# Patient Record
Sex: Female | Born: 1950 | Race: White | Hispanic: No | Marital: Married | Smoking: Never smoker
Health system: Southern US, Community
[De-identification: ages and names within clinical notes are randomized; demographics above are authoritative.]

## PROBLEM LIST (undated history)

## (undated) DIAGNOSIS — I10 Essential (primary) hypertension: Secondary | ICD-10-CM

## (undated) DIAGNOSIS — K810 Acute cholecystitis: Secondary | ICD-10-CM

## (undated) DIAGNOSIS — H269 Unspecified cataract: Secondary | ICD-10-CM

## (undated) DIAGNOSIS — Z01419 Encounter for gynecological examination (general) (routine) without abnormal findings: Secondary | ICD-10-CM

## (undated) DIAGNOSIS — R112 Nausea with vomiting, unspecified: Secondary | ICD-10-CM

## (undated) DIAGNOSIS — T7840XA Allergy, unspecified, initial encounter: Secondary | ICD-10-CM

## (undated) DIAGNOSIS — E782 Mixed hyperlipidemia: Secondary | ICD-10-CM

## (undated) DIAGNOSIS — Z9889 Other specified postprocedural states: Secondary | ICD-10-CM

## (undated) DIAGNOSIS — M1990 Unspecified osteoarthritis, unspecified site: Secondary | ICD-10-CM

## (undated) HISTORY — DX: Encounter for gynecological examination (general) (routine) without abnormal findings: Z01.419

## (undated) HISTORY — DX: Essential (primary) hypertension: I10

## (undated) HISTORY — DX: Allergy, unspecified, initial encounter: T78.40XA

## (undated) HISTORY — DX: Unspecified cataract: H26.9

## (undated) HISTORY — DX: Unspecified osteoarthritis, unspecified site: M19.90

## (undated) HISTORY — DX: Mixed hyperlipidemia: E78.2

## (undated) HISTORY — DX: Acute cholecystitis: K81.0

## (undated) HISTORY — PX: TONSILLECTOMY: SUR1361

## (undated) HISTORY — PX: OTHER SURGICAL HISTORY: SHX169

---

## 1983-11-11 HISTORY — PX: TUBAL LIGATION: SHX77

## 1999-11-27 ENCOUNTER — Other Ambulatory Visit: Admission: RE | Admit: 1999-11-27 | Discharge: 1999-11-27 | Payer: Self-pay | Admitting: Gynecology

## 2001-07-06 ENCOUNTER — Other Ambulatory Visit: Admission: RE | Admit: 2001-07-06 | Discharge: 2001-07-06 | Payer: Self-pay | Admitting: Obstetrics & Gynecology

## 2003-01-04 ENCOUNTER — Other Ambulatory Visit: Admission: RE | Admit: 2003-01-04 | Discharge: 2003-01-04 | Payer: Self-pay | Admitting: Obstetrics & Gynecology

## 2004-06-11 ENCOUNTER — Other Ambulatory Visit: Admission: RE | Admit: 2004-06-11 | Discharge: 2004-06-11 | Payer: Self-pay | Admitting: Obstetrics and Gynecology

## 2005-12-31 ENCOUNTER — Ambulatory Visit: Payer: Self-pay | Admitting: Family Medicine

## 2006-01-08 ENCOUNTER — Ambulatory Visit: Payer: Self-pay | Admitting: Family Medicine

## 2006-01-16 ENCOUNTER — Ambulatory Visit: Payer: Self-pay | Admitting: Family Medicine

## 2006-02-08 HISTORY — PX: COLONOSCOPY: SHX174

## 2006-02-09 ENCOUNTER — Ambulatory Visit: Payer: Self-pay | Admitting: Internal Medicine

## 2006-02-23 ENCOUNTER — Encounter (INDEPENDENT_AMBULATORY_CARE_PROVIDER_SITE_OTHER): Payer: Self-pay | Admitting: Specialist

## 2006-02-23 ENCOUNTER — Ambulatory Visit: Payer: Self-pay | Admitting: Internal Medicine

## 2007-07-23 DIAGNOSIS — E782 Mixed hyperlipidemia: Secondary | ICD-10-CM

## 2007-07-23 DIAGNOSIS — I10 Essential (primary) hypertension: Secondary | ICD-10-CM

## 2007-07-23 HISTORY — DX: Mixed hyperlipidemia: E78.2

## 2007-07-23 HISTORY — DX: Essential (primary) hypertension: I10

## 2008-01-20 ENCOUNTER — Ambulatory Visit: Payer: Self-pay | Admitting: Family Medicine

## 2008-01-20 DIAGNOSIS — K644 Residual hemorrhoidal skin tags: Secondary | ICD-10-CM

## 2008-01-21 ENCOUNTER — Telehealth: Payer: Self-pay | Admitting: Family Medicine

## 2008-06-16 ENCOUNTER — Ambulatory Visit: Payer: Self-pay | Admitting: Family Medicine

## 2009-10-16 ENCOUNTER — Telehealth: Payer: Self-pay | Admitting: Family Medicine

## 2009-10-17 ENCOUNTER — Ambulatory Visit: Payer: Self-pay | Admitting: Family Medicine

## 2010-01-02 ENCOUNTER — Ambulatory Visit: Payer: Self-pay | Admitting: Family Medicine

## 2010-01-02 DIAGNOSIS — J069 Acute upper respiratory infection, unspecified: Secondary | ICD-10-CM

## 2010-11-21 ENCOUNTER — Ambulatory Visit
Admission: RE | Admit: 2010-11-21 | Discharge: 2010-11-21 | Payer: Self-pay | Source: Home / Self Care | Attending: Family Medicine | Admitting: Family Medicine

## 2010-11-21 DIAGNOSIS — M545 Low back pain, unspecified: Secondary | ICD-10-CM | POA: Insufficient documentation

## 2010-12-10 NOTE — Assessment & Plan Note (Signed)
Summary: congestion//ccm   Vital Signs:  Patient profile:   60 year old female Weight:      216 pounds Temp:     99.1 degrees F oral Pulse rate:   98 / minute BP sitting:   112 / 78  (left arm) Cuff size:   large  Vitals Entered By: Alfred Levins, CMA (January 02, 2010 11:22 AM) CC: st, fever, cough   History of Present Illness: Here for 2 days of aches, HA, sinus congestion, ST, and a dry cough. No NVD.   Current Medications (verified): 1)  Diovan Hct 160-25 Mg Tabs (Valsartan-Hydrochlorothiazide) .Marland Kitchen.. 1 By Mouth Once Daily 2)  Aspirin 325 Mg Tabs (Aspirin) .... Take 1 Tab By Mouth Every Day  Allergies (verified): No Known Drug Allergies  Past History:  Past Medical History: Reviewed history from 10/17/2009 and no changes required. Hyperlipidemia Hypertension sees Dr. Ilda Mori for GYN exams  Review of Systems  The patient denies anorexia, fever, weight loss, weight gain, vision loss, decreased hearing, hoarseness, chest pain, syncope, dyspnea on exertion, peripheral edema, hemoptysis, abdominal pain, melena, hematochezia, severe indigestion/heartburn, hematuria, incontinence, genital sores, muscle weakness, suspicious skin lesions, transient blindness, difficulty walking, depression, unusual weight change, abnormal bleeding, enlarged lymph nodes, angioedema, breast masses, and testicular masses.    Physical Exam  General:  Well-developed,well-nourished,in no acute distress; alert,appropriate and cooperative throughout examination Head:  Normocephalic and atraumatic without obvious abnormalities. No apparent alopecia or balding. Eyes:  No corneal or conjunctival inflammation noted. EOMI. Perrla. Funduscopic exam benign, without hemorrhages, exudates or papilledema. Vision grossly normal. Ears:  External ear exam shows no significant lesions or deformities.  Otoscopic examination reveals clear canals, tympanic membranes are intact bilaterally without bulging,  retraction, inflammation or discharge. Hearing is grossly normal bilaterally. Nose:  External nasal examination shows no deformity or inflammation. Nasal mucosa are pink and moist without lesions or exudates. Mouth:  Oral mucosa and oropharynx without lesions or exudates.  Teeth in good repair. Neck:  No deformities, masses, or tenderness noted. Lungs:  Normal respiratory effort, chest expands symmetrically. Lungs are clear to auscultation, no crackles or wheezes.   Impression & Recommendations:  Problem # 1:  VIRAL URI (ICD-465.9)  Her updated medication list for this problem includes:    Aspirin 325 Mg Tabs (Aspirin) .Marland Kitchen... Take 1 tab by mouth every day    Hydromet 5-1.5 Mg/52ml Syrp (Hydrocodone-homatropine) .Marland Kitchen... 1 tsp q 4 hours as needed cough  Complete Medication List: 1)  Diovan Hct 160-25 Mg Tabs (Valsartan-hydrochlorothiazide) .Marland Kitchen.. 1 by mouth once daily 2)  Aspirin 325 Mg Tabs (Aspirin) .... Take 1 tab by mouth every day 3)  Hydromet 5-1.5 Mg/59ml Syrp (Hydrocodone-homatropine) .Marland Kitchen.. 1 tsp q 4 hours as needed cough  Patient Instructions: 1)  rest, fluids. off work today and tomorrow Prescriptions: HYDROMET 5-1.5 MG/5ML SYRP (HYDROCODONE-HOMATROPINE) 1 tsp q 4 hours as needed cough  #240 x 0   Entered and Authorized by:   Nelwyn Salisbury MD   Signed by:   Nelwyn Salisbury MD on 01/02/2010   Method used:   Print then Give to Patient   RxID:   [id number]

## 2010-12-12 NOTE — Assessment & Plan Note (Signed)
Summary: consult re: various issues/cjr   Vital Signs:  Patient profile:   60 year old female Weight:      209 pounds O2 Sat:      96 % Pulse rate:   98 / minute BP sitting:   140 / 86  (left arm) Cuff size:   large  Vitals Entered By: Pura Spice, RN (November 21, 2010 8:41 AM) CC: mid back pain going to lower back x 2 months    History of Present Illness: Here for 2 months of constant dull aching in the lower back. No trauma hx. She also has pain in both knees. No urinary symtpoms. Takes 800 mg of Motrin two times a day usually.   Allergies (verified): No Known Drug Allergies  Past History:  Past Medical History: Hyperlipidemia Hypertension sees Dr. Ilda Mori for GYN exams Low back pain  Past Surgical History: Reviewed history from 07/23/2007 and no changes required. Tubal ligation Tonsillectomy Colonoscopy  Review of Systems  The patient denies anorexia, fever, weight loss, weight gain, vision loss, decreased hearing, hoarseness, chest pain, syncope, dyspnea on exertion, peripheral edema, prolonged cough, headaches, hemoptysis, abdominal pain, melena, hematochezia, severe indigestion/heartburn, hematuria, incontinence, genital sores, muscle weakness, suspicious skin lesions, transient blindness, difficulty walking, depression, unusual weight change, abnormal bleeding, enlarged lymph nodes, angioedema, breast masses, and testicular masses.    Physical Exam  General:  Well-developed,well-nourished,in no acute distress; alert,appropriate and cooperative throughout examination Lungs:  Normal respiratory effort, chest expands symmetrically. Lungs are clear to auscultation, no crackles or wheezes. Heart:  Normal rate and regular rhythm. S1 and S2 normal without gallop, murmur, click, rub or other extra sounds. Abdomen:  Bowel sounds positive,abdomen soft and non-tender without masses, organomegaly or hernias noted. Msk:  No deformity or scoliosis noted of thoracic or  lumbar spine.     Impression & Recommendations:  Problem # 1:  LOW BACK PAIN (ICD-724.2)  Her updated medication list for this problem includes:    Aspirin 325 Mg Tabs (Aspirin) .Marland Kitchen... Take 1 tab by mouth every day    Etodolac 500 Mg Tabs (Etodolac) .Marland Kitchen..Marland Kitchen Two times a day  Orders: T-Lumbar Spine Complete, 5 Views (71110TC)  Complete Medication List: 1)  Diovan Hct 160-25 Mg Tabs (Valsartan-hydrochlorothiazide) .Marland Kitchen.. 1 by mouth once daily 2)  Aspirin 325 Mg Tabs (Aspirin) .... Take 1 tab by mouth every day 3)  Etodolac 500 Mg Tabs (Etodolac) .... Two times a day  Patient Instructions: 1)  this is probably due to degenerative spine disease. Suggested Pilates exercises and weight reduction. try Etodolac. Get Xrays of the lumbar spine  Prescriptions: ETODOLAC 500 MG TABS (ETODOLAC) two times a day  #60 x 11   Entered and Authorized by:   Nelwyn Salisbury MD   Signed by:   Nelwyn Salisbury MD on 11/21/2010   Method used:   Electronically to        [facility] [phone][facility] (retail)       [address]       [place], [place]  [address]       Ph: [phone]       Fax: [phone]   RxID:   [id number]    Orders Added: 1)  Est. Patient Level IV [24401] 2)  T-Lumbar Spine Complete, 5 Views [71110TC]

## 2011-01-01 ENCOUNTER — Ambulatory Visit (INDEPENDENT_AMBULATORY_CARE_PROVIDER_SITE_OTHER): Payer: Managed Care, Other (non HMO) | Admitting: Family Medicine

## 2011-01-01 ENCOUNTER — Encounter: Payer: Self-pay | Admitting: Family Medicine

## 2011-01-01 VITALS — BP 100/60 | HR 94 | Temp 97.8°F

## 2011-01-01 DIAGNOSIS — R42 Dizziness and giddiness: Secondary | ICD-10-CM

## 2011-01-01 MED ORDER — MECLIZINE HCL 25 MG PO TABS: 25.0000 mg | ORAL_TABLET | ORAL | Status: DC | PRN

## 2011-01-01 NOTE — Progress Notes (Signed)
  Subjective:    Patient ID: Alicia Mcbride, female    DOB: 06-Dec-1950, 60 y.o.   MRN: [id number]  HPI Here for several days of dizziness which comes and goes. She describes this as the room spinning around her. It is worse when she moves her head suddenly. No nausea or HA with these spells. No vision changes or other neurologic deficits.    Review of Systems  Constitutional: Negative.   HENT: Negative.   Eyes: Negative.   Respiratory: Negative.   Neurological: Positive for dizziness. Negative for tremors, seizures, syncope, speech difficulty, weakness, light-headedness, numbness and headaches.       Objective:   Physical Exam        Assessment & Plan:  Rest, drink fluids. Off work Advertising account executive.

## 2011-05-12 ENCOUNTER — Other Ambulatory Visit: Payer: Self-pay | Admitting: Family Medicine

## 2011-05-28 ENCOUNTER — Telehealth: Payer: Self-pay

## 2011-05-28 NOTE — Telephone Encounter (Signed)
Pt has been on diovan for several years and new insurance co-pay for diovan is $85. [place] and wants to know if she can get samples until she can see Dr. Clent Ridges to discuss other options.

## 2011-05-29 NOTE — Telephone Encounter (Signed)
Left message to notify pt that we have no diovan samples in the office right now

## 2011-06-19 ENCOUNTER — Telehealth: Payer: Self-pay | Admitting: Family Medicine

## 2011-06-19 MED ORDER — VALSARTAN-HYDROCHLOROTHIAZIDE 160-25 MG PO TABS: 1.0000 | ORAL_TABLET | Freq: Every day | ORAL | Status: DC

## 2011-06-19 NOTE — Telephone Encounter (Signed)
Pt requesting 90 day refill on DIOVAN HCT 160-25 MG per tablet . Pt said with her new insurance plan she can get the meds discounted if she gets a 90 day supply. Pt has currently been off the medication for 2 week and if she cant get the 90 day supply she will not be able to afford it.

## 2011-06-19 NOTE — Telephone Encounter (Signed)
Script sent e-scribe 

## 2011-06-20 ENCOUNTER — Other Ambulatory Visit: Payer: Self-pay | Admitting: Family Medicine

## 2011-06-20 MED ORDER — VALSARTAN-HYDROCHLOROTHIAZIDE 160-25 MG PO TABS: 1.0000 | ORAL_TABLET | Freq: Every day | ORAL | Status: DC

## 2011-09-25 ENCOUNTER — Other Ambulatory Visit: Payer: Self-pay | Admitting: Family Medicine

## 2011-09-25 MED ORDER — LOSARTAN POTASSIUM-HCTZ 100-25 MG PO TABS: 1.0000 | ORAL_TABLET | Freq: Every day | ORAL | Status: DC

## 2011-09-25 NOTE — Telephone Encounter (Signed)
Pls advise.  

## 2011-09-25 NOTE — Telephone Encounter (Signed)
rx sent into pharmacy

## 2011-09-25 NOTE — Telephone Encounter (Signed)
Pt called and said that if no samples for diovan are avail, pt is req a [facility] [place] [facility]. Pt says that shes been out of bp med for a wk and feels that her bp is high because she is dizzy. Pls call asap. Pt says insurance will cover the Losartan.

## 2011-09-25 NOTE — Telephone Encounter (Addendum)
Pt need samples of diovan 160-25mg . Pt is out

## 2011-09-25 NOTE — Telephone Encounter (Signed)
Stop the Diovan Hct. Call in Losartan HCT 100/25 q day for one year

## 2011-11-20 ENCOUNTER — Ambulatory Visit (INDEPENDENT_AMBULATORY_CARE_PROVIDER_SITE_OTHER): Payer: 59 | Admitting: Family Medicine

## 2011-11-20 ENCOUNTER — Encounter: Payer: Self-pay | Admitting: Family Medicine

## 2011-11-20 VITALS — BP 126/84 | HR 79 | Temp 98.4°F | Ht 67.0 in | Wt 215.0 lb

## 2011-11-20 DIAGNOSIS — Z860100 Personal history of colon polyps, unspecified: Secondary | ICD-10-CM

## 2011-11-20 DIAGNOSIS — Z0000 Encounter for general adult medical examination without abnormal findings: Secondary | ICD-10-CM

## 2011-11-20 LAB — CBC WITH DIFFERENTIAL/PLATELET
Eosinophils Relative: 2 % (ref 0.0–5.0)
HCT: 42.3 % (ref 36.0–46.0)
Lymphs Abs: 2 10*3/uL (ref 0.7–4.0)
MCHC: 34.9 g/dL (ref 30.0–36.0)
MCV: 89.4 fl (ref 78.0–100.0)
Monocytes Absolute: 0.4 10*3/uL (ref 0.1–1.0)
Platelets: 305 10*3/uL (ref 150.0–400.0)
RDW: 13.4 % (ref 11.5–14.6)
WBC: 7.2 10*3/uL (ref 4.5–10.5)

## 2011-11-20 LAB — BASIC METABOLIC PANEL
CO2: 29 mEq/L (ref 19–32)
Chloride: 102 mEq/L (ref 96–112)
Glucose, Bld: 89 mg/dL (ref 70–99)
Sodium: 140 mEq/L (ref 135–145)

## 2011-11-20 LAB — HEPATIC FUNCTION PANEL
AST: 14 U/L (ref 0–37)
Alkaline Phosphatase: 69 U/L (ref 39–117)
Bilirubin, Direct: 0.1 mg/dL (ref 0.0–0.3)
Total Bilirubin: 0.9 mg/dL (ref 0.3–1.2)

## 2011-11-20 LAB — POCT URINALYSIS DIPSTICK
Glucose, UA: NEGATIVE
Ketones, UA: NEGATIVE
Spec Grav, UA: 1.025

## 2011-11-20 LAB — LIPID PANEL
Cholesterol: 222 mg/dL — ABNORMAL HIGH (ref 0–200)
Total CHOL/HDL Ratio: 4

## 2011-11-20 LAB — LDL CHOLESTEROL, DIRECT: Direct LDL: 137.1 mg/dL

## 2011-11-20 LAB — TSH: TSH: 0.68 u[IU]/mL (ref 0.35–5.50)

## 2011-11-20 MED ORDER — MELOXICAM 15 MG PO TABS: 15.0000 mg | ORAL_TABLET | Freq: Every day | ORAL | Status: AC

## 2011-11-20 NOTE — Progress Notes (Signed)
  Subjective:    Patient ID: Alicia Mcbride, female    DOB: 07-15-1951, 61 y.o.   MRN: [id number]  HPI 61 yr old female for a cpx. She feels well except for leg pains. 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 However in the past few months she has also been having crampy pains in the calves. No swelling in the feet.    Review of Systems  Constitutional: Negative.   HENT: Negative.   Eyes: Negative.   Respiratory: Negative.   Cardiovascular: Negative.   Gastrointestinal: Negative.   Genitourinary: Negative for dysuria, urgency, frequency, hematuria, flank pain, decreased urine volume, enuresis, difficulty urinating, pelvic pain and dyspareunia.  Musculoskeletal: Negative.   Skin: Negative.   Neurological: Negative.   Hematological: Negative.   Psychiatric/Behavioral: Negative.        Objective:   Physical Exam  Constitutional: She is oriented to person, place, and time. She appears well-developed and well-nourished. No distress.  HENT:  Head: Normocephalic and atraumatic.  Right Ear: External ear normal.  Left Ear: External ear normal.  Nose: Nose normal.  Mouth/Throat: Oropharynx is clear and moist. No oropharyngeal exudate.  Eyes: Conjunctivae and EOM are normal. Pupils are equal, round, and reactive to light. No scleral icterus.  Neck: Normal range of motion. Neck supple. No JVD present. No thyromegaly present.  Cardiovascular: Normal rate, regular rhythm, normal heart sounds and intact distal pulses.  Exam reveals no gallop and no friction rub.   No murmur heard.      EKG normal   Pulmonary/Chest: Effort normal and breath sounds normal. No respiratory distress. She has no wheezes. She has no rales. She exhibits no tenderness.  Abdominal: Soft. Bowel sounds are normal. She exhibits no distension and no mass. There is no tenderness. There is no rebound and no guarding.  Musculoskeletal: Normal range of motion. She exhibits no  edema and no tenderness.  Lymphadenopathy:    She has no cervical adenopathy.  Neurological: She is alert and oriented to person, place, and time. She has normal reflexes. No cranial nerve deficit. She exhibits normal muscle tone. Coordination normal.  Skin: Skin is warm and dry. No rash noted. No erythema.  Psychiatric: She has a normal mood and affect. Her behavior is normal. Judgment and thought content normal.          Assessment & Plan:  She probably has a low potassium from taking a diuretic, but we will look at her labs which will be drawn today. Switch from Etodolac to Meloxicam. Get another colonoscopy.

## 2011-11-25 ENCOUNTER — Telehealth: Payer: Self-pay | Admitting: *Deleted

## 2011-11-25 ENCOUNTER — Encounter: Payer: Self-pay | Admitting: Family Medicine

## 2011-11-25 NOTE — Telephone Encounter (Signed)
Spoke with pt and put a copy in mail. 

## 2011-11-25 NOTE — Telephone Encounter (Signed)
Pt would like lab results, please. 

## 2011-11-25 NOTE — Progress Notes (Signed)
Quick Note:  Spoke with pt and put a copy of results in mail. ______ 

## 2012-12-28 ENCOUNTER — Telehealth: Payer: Self-pay | Admitting: Family Medicine

## 2012-12-28 MED ORDER — LOSARTAN POTASSIUM-HCTZ 100-25 MG PO TABS: 1.0000 | ORAL_TABLET | Freq: Every day | ORAL | Status: DC

## 2012-12-28 NOTE — Telephone Encounter (Signed)
Refill request for Losartan/HCTZ 100-25 mg take 1 po qd and send to [facility], which I did send e-scribe.

## 2013-01-26 ENCOUNTER — Telehealth: Payer: Self-pay | Admitting: Family Medicine

## 2013-01-26 NOTE — Telephone Encounter (Signed)
Refill request for Mobic 15 mg take 1 po qd and send to [facility] pharmacy

## 2013-01-28 MED ORDER — MELOXICAM 15 MG PO TABS: 15.0000 mg | ORAL_TABLET | Freq: Every day | ORAL | Status: DC

## 2013-01-28 NOTE — Telephone Encounter (Signed)
I sent script e-scribe. 

## 2013-03-04 ENCOUNTER — Telehealth: Payer: Self-pay | Admitting: Family Medicine

## 2013-03-04 NOTE — Telephone Encounter (Signed)
Refill for one month. She needs an OV soon

## 2013-03-04 NOTE — Telephone Encounter (Signed)
Refill request for Losartan/HCTZ 100-25 mg, can we refill this?

## 2013-03-07 MED ORDER — LOSARTAN POTASSIUM-HCTZ 100-25 MG PO TABS: 1.0000 | ORAL_TABLET | Freq: Every day | ORAL | Status: DC

## 2013-03-07 NOTE — Telephone Encounter (Signed)
I sent script e-scribe and left voice message for pt with below information. 

## 2013-04-12 ENCOUNTER — Telehealth: Payer: Self-pay | Admitting: Family Medicine

## 2013-04-12 NOTE — Telephone Encounter (Signed)
Pt needs a refill on meloxicam and losartan-hctz call into [facility] [phone]. Pt has appt on 04-29-13. Pt is getting married unable to come in sooner

## 2013-04-13 MED ORDER — LOSARTAN POTASSIUM-HCTZ 100-25 MG PO TABS: 1.0000 | ORAL_TABLET | Freq: Every day | ORAL | Status: DC

## 2013-04-13 MED ORDER — MELOXICAM 15 MG PO TABS: 15.0000 mg | ORAL_TABLET | Freq: Every day | ORAL | Status: DC

## 2013-04-13 NOTE — Telephone Encounter (Signed)
I sent scripts e-scribe and left voice message for pt. 

## 2013-04-29 ENCOUNTER — Ambulatory Visit (INDEPENDENT_AMBULATORY_CARE_PROVIDER_SITE_OTHER): Payer: 59 | Admitting: Family Medicine

## 2013-04-29 ENCOUNTER — Encounter: Payer: Self-pay | Admitting: Family Medicine

## 2013-04-29 VITALS — BP 126/78 | HR 87 | Temp 97.6°F | Wt 215.0 lb

## 2013-04-29 DIAGNOSIS — E785 Hyperlipidemia, unspecified: Secondary | ICD-10-CM

## 2013-04-29 DIAGNOSIS — M1710 Unilateral primary osteoarthritis, unspecified knee: Secondary | ICD-10-CM | POA: Insufficient documentation

## 2013-04-29 DIAGNOSIS — M1990 Unspecified osteoarthritis, unspecified site: Secondary | ICD-10-CM

## 2013-04-29 DIAGNOSIS — I10 Essential (primary) hypertension: Secondary | ICD-10-CM

## 2013-04-29 LAB — POCT URINALYSIS DIPSTICK
Ketones, UA: NEGATIVE
Protein, UA: NEGATIVE
Spec Grav, UA: 1.01
pH, UA: 7

## 2013-04-29 LAB — HEPATIC FUNCTION PANEL
ALT: 20 U/L (ref 0–35)
AST: 19 U/L (ref 0–37)
Bilirubin, Direct: 0.1 mg/dL (ref 0.0–0.3)
Total Bilirubin: 0.8 mg/dL (ref 0.3–1.2)

## 2013-04-29 LAB — LIPID PANEL: VLDL: 32.2 mg/dL (ref 0.0–40.0)

## 2013-04-29 LAB — BASIC METABOLIC PANEL
BUN: 16 mg/dL (ref 6–23)
Chloride: 99 mEq/L (ref 96–112)
Potassium: 4.2 mEq/L (ref 3.5–5.1)

## 2013-04-29 MED ORDER — LOSARTAN POTASSIUM-HCTZ 100-25 MG PO TABS: 1.0000 | ORAL_TABLET | Freq: Every day | ORAL | Status: DC

## 2013-04-29 MED ORDER — MELOXICAM 15 MG PO TABS: 15.0000 mg | ORAL_TABLET | Freq: Every day | ORAL | Status: DC

## 2013-04-29 NOTE — Progress Notes (Signed)
  Subjective:    Patient ID: Alicia Mcbride, female    DOB: 1950/12/29, 62 y.o.   MRN: [id number]  HPI Here for follow up. She feels good and has no concerns. Her BP is stable. She recently remarried and is quite happy. Thinking about retiring next year.    Review of Systems  Constitutional: Negative.   Respiratory: Negative.   Cardiovascular: Negative.        Objective:   Physical Exam  Constitutional: She appears well-developed and well-nourished.  Cardiovascular: Normal rate, regular rhythm, normal heart sounds and intact distal pulses.   Pulmonary/Chest: Effort normal and breath sounds normal.          Assessment & Plan:  She is doing well. Get fasting labs

## 2013-04-30 LAB — CBC WITH DIFFERENTIAL/PLATELET
Basophils Absolute: 0.1 10*3/uL (ref 0.0–0.1)
Eosinophils Absolute: 0.2 10*3/uL (ref 0.0–0.7)
HCT: 44.9 % (ref 36.0–46.0)
Hemoglobin: 15.1 g/dL — ABNORMAL HIGH (ref 12.0–15.0)
Lymphs Abs: 2.1 10*3/uL (ref 0.7–4.0)
MCHC: 33.5 g/dL (ref 30.0–36.0)
MCV: 92.5 fl (ref 78.0–100.0)
Neutro Abs: 6.3 10*3/uL (ref 1.4–7.7)
RDW: 13.9 % (ref 11.5–14.6)

## 2013-05-04 NOTE — Progress Notes (Signed)
Quick Note:  I spoke with pt and she declined to start on Lipitor, she did agree to watch her diet more closely. ______

## 2014-01-23 ENCOUNTER — Other Ambulatory Visit: Payer: Self-pay | Admitting: Obstetrics & Gynecology

## 2014-01-23 DIAGNOSIS — R928 Other abnormal and inconclusive findings on diagnostic imaging of breast: Secondary | ICD-10-CM

## 2014-01-26 ENCOUNTER — Ambulatory Visit
Admission: RE | Admit: 2014-01-26 | Discharge: 2014-01-26 | Disposition: A | Discharge status: Home / Self Care | Payer: 59 | Source: Ambulatory Visit | Attending: Obstetrics & Gynecology | Admitting: Obstetrics & Gynecology

## 2014-01-26 DIAGNOSIS — R928 Other abnormal and inconclusive findings on diagnostic imaging of breast: Secondary | ICD-10-CM

## 2014-02-02 ENCOUNTER — Other Ambulatory Visit: Payer: 59

## 2014-02-15 ENCOUNTER — Telehealth: Payer: Self-pay | Admitting: Family Medicine

## 2014-02-15 NOTE — Telephone Encounter (Signed)
Patient Information:  Caller Name: Narely  Phone: [phone]  Patient: Alicia Mcbride, Alicia Mcbride  Gender: Female  DOB: [birth year]  Age: 63 Years  PCP: [name] [facility])  Office Follow Up:  Does the office need to follow up with this patient?: Yes  Instructions For The Office: Please return call to patient at [phone] regarding appt. need.  RN Note:  Patient states she noted a Tick on her abdomen 02/14/14. Patient states she removed the tick 02/15/14. States tick was removed with head intact. Patient states she has a raised, itchy area on her abdomen approx. the size of a  " [place] Dollar." States no redness or red streaks noted. Care advice given per guidelines. Patient advised to cleanse area with soap and water. Then cleanse with alcohol. Callback parameters reviewed. Patient verbalizes understanding. No appts. available at [facility] office. Patient agrees to be seen at [facility] office. No appts. available at the [facility] office.  Please return call to patient at [phone] regarding appt. need.    Symptoms  Reason For Call & Symptoms: Tick bite  Reviewed Health History In EMR: Yes  Reviewed Medications In EMR: Yes  Reviewed Allergies In EMR: Yes  Reviewed Surgeries / Procedures: Yes  Date of Onset of Symptoms: 02/14/2014  Treatments Tried: Cleansed with Peroxide  Treatments Tried Worked: No  Guideline(s) Used:  Tick Bite  Disposition Per Guideline:   See Today in Office  Reason For Disposition Reached:   Probable deer tick that was attached > 24 hours (or tick appears swollen, not flat)  Advice Given:  Antibiotic Ointment:  Wash the wound and your hands with soap and water after removal to prevent catching any tick disease. Apply an over-the-counter antibiotic ointment (e.g., bacitracin) to the bite once.  Call Back If:  Fever or rash occur in the next 2 weeks  Bite begins to look infected  You become worse.  Patient Will Follow Care Advice:   YES

## 2014-02-15 NOTE — Telephone Encounter (Signed)
I spoke with Dr. Regis Bill and after reviewing note from triage, she advised that pt can be seen on 02/16/14. The guidelines for tick bite include: prophylaxis is begun within 72 hours of tick removal. Pt just removed tick this morning. We have no openings here today. I spoke with pt and went over this information and pt has office visit scheduled for 02/16/14 with Dr. Regis Bill.

## 2014-02-16 ENCOUNTER — Encounter: Payer: Self-pay | Admitting: Internal Medicine

## 2014-02-16 ENCOUNTER — Ambulatory Visit (INDEPENDENT_AMBULATORY_CARE_PROVIDER_SITE_OTHER): Payer: 59 | Admitting: Internal Medicine

## 2014-02-16 VITALS — BP 126/70 | Temp 98.6°F | Wt 208.0 lb

## 2014-02-16 DIAGNOSIS — S30860A Insect bite (nonvenomous) of lower back and pelvis, initial encounter: Secondary | ICD-10-CM

## 2014-02-16 DIAGNOSIS — R21 Rash and other nonspecific skin eruption: Secondary | ICD-10-CM

## 2014-02-16 DIAGNOSIS — S30861A Insect bite (nonvenomous) of abdominal wall, initial encounter: Secondary | ICD-10-CM

## 2014-02-16 DIAGNOSIS — W57XXXA Bitten or stung by nonvenomous insect and other nonvenomous arthropods, initial encounter: Principal | ICD-10-CM

## 2014-02-16 MED ORDER — DOXYCYCLINE HYCLATE 100 MG PO CAPS: 100.0000 mg | ORAL_CAPSULE | Freq: Two times a day (BID) | ORAL | Status: DC

## 2014-02-16 NOTE — Progress Notes (Signed)
Chief Complaint  Patient presents with  . Tick Bite    Removed from abdomen.      HPI: Patient comes in today for   new problem evaluation.PCP NA  See notes  From yesterday . Tick bite removed yesterday  Right trunk  ? If deer tick  Travel no  Not outside that much but has a dog and wooded area  Uncertain how lont the tick attacked    Had etoh on easter Sunday and then noticed it  And thinks it was dead when removed  Area around bite was redder yesterday  About 4-5 cm  Fading some  Itches  No other rash fever joint sx.   ROS: See pertinent positives and negatives per HPI.  Past Medical History  Diagnosis Date  . Hypertension   . Allergy   . Gynecological examination     sees Dr. Alden Hipp    Family History  Problem Relation Age of Onset  . Hypertension Mother   . Thyroid disease Father     History   Social History  . Marital Status: Single    Spouse Name: N/A    Number of Children: N/A  . Years of Education: N/A   Social History Main Topics  . Smoking status: Never Smoker   . Smokeless tobacco: Never Used  . Alcohol Use: 4.2 oz/week    7 Glasses of wine per week  . Drug Use: No  . Sexual Activity: None   Other Topics Concern  . None   Social History Narrative  . None    Outpatient Encounter Prescriptions as of 02/16/2014  Medication Sig  . losartan-hydrochlorothiazide (HYZAAR) 100-25 MG per tablet Take 1 tablet by mouth daily.  . meloxicam (MOBIC) 15 MG tablet Take 1 tablet (15 mg total) by mouth daily.  Marland Kitchen doxycycline (VIBRAMYCIN) 100 MG capsule Take 1 capsule (100 mg total) by mouth 2 (two) times daily.    EXAM:  BP 126/70  Temp(Src) 98.6 F (37 C) (Oral)  Wt 208 lb (94.348 kg)  Body mass index is 32.57 kg/(m^2).  GENERAL: vitals reviewed and listed above, alert, oriented, appears well hydrated and in no acute distress HEENT: atraumatic, conjunctiva  clear, no obvious abnormalities on inspection of external nose and ears   NECK: no obvious  masses on inspection palpation  CV: HRRR, no clubbing cyanosis  nl cap refill  Skin right abd trunk mid cnetral redness without  Some induration and no fluctuances and linear  Area edged about 5 cm round with some clearing  Faded  MS: moves all extremities without noticeable focal  abnormality PSYCH: pleasant and cooperative, no obvious depression or anxiety Tick dead is very small about 1-2 mm ? Uniform color without lone star dot ? If wood vs deer tick  ASSESSMENT AND PLAN:  Discussed the following assessment and plan:  Tick bite of abdomen - tick is very shirveled and uncertain ID     Rash - around bite  fading but has round ridge could mkae a case for EM  tick  Location not high risk but because of rash  And unknown length of attachment  Will add wmpiric antibiotic  benefit more than risk at this time -Patient advised to return or notify health care team  if symptoms worsen ,persist or new concerns arise.  Patient Instructions  ncertain if this is a deer tick or wood tick etc .  Because of the rash   This could be just a local reaction  Contact us if alarm issues   Tick Bite Information Ticks are insects that attach themselves to the skin and draw blood for food. There are various types of ticks. Common types include wood ticks and deer ticks. Most ticks live in shrubs and grassy areas. Ticks can climb onto your body when you make contact with leaves or grass where the tick is waiting. The most common places on the body for ticks to attach themselves are the scalp, neck, armpits, waist, and groin. Most tick bites are harmless, but sometimes ticks carry germs that cause diseases. These germs can be spread to a person during the tick's feeding process. The chance of a disease spreading through a tick bite depends on:   The type of tick.  Time of year.   How long the tick is attached.   Geographic location.  HOW CAN YOU PREVENT TICK BITES? Take these steps to help prevent tick  bites when you are outdoors:  Wear protective clothing. Long sleeves and long pants are best.   Wear white clothes so you can see ticks more easily.  Tuck your pant legs into your socks.   If walking on a trail, stay in the middle of the trail to avoid brushing against bushes.  Avoid walking through areas with long grass.  Put insect repellent on all exposed skin and along boot tops, pant legs, and sleeve cuffs.   Check clothing, hair, and skin repeatedly and before going inside.   Brush off any ticks that are not attached.  Take a shower or bath as soon as possible after being outdoors.  WHAT IS THE PROPER WAY TO REMOVE A TICK? Ticks should be removed as soon as possible to help prevent diseases caused by tick bites. 1. If latex gloves are available, put them on before trying to remove a tick.  2. Using fine-point tweezers, grasp the tick as close to the skin as possible. You may also use curved forceps or a tick removal tool. Grasp the tick as close to its head as possible. Avoid grasping the tick on its body. 3. Pull gently with steady upward pressure until the tick lets go. Do not twist the tick or jerk it suddenly. This may break off the tick's head or mouth parts. 4. Do not squeeze or crush the tick's body. This could force disease-carrying fluids from the tick into your body.  5. After the tick is removed, wash the bite area and your hands with soap and water or other disinfectant such as alcohol. 6. Apply a small amount of antiseptic cream or ointment to the bite site.  7. Wash and disinfect any instruments that were used.  Do not try to remove a tick by applying a hot match, petroleum jelly, or fingernail polish to the tick. These methods do not work and may increase the chances of disease being spread from the tick bite.  WHEN SHOULD YOU SEEK MEDICAL CARE? Contact your health care provider if you are unable to remove a tick from your skin or if a part of the tick  breaks off and is stuck in the skin.  After a tick bite, you need to be aware of signs and symptoms that could be related to diseases spread by ticks. Contact your health care provider if you develop any of the following in the days or weeks after the tick bite:  Unexplained fever.  Rash. A circular rash that appears days or weeks after the tick bite may indicate the  possibility of Lyme disease. The rash may resemble a target with a bull's-eye and may occur at a different part of your body than the tick bite.  Redness and swelling in the area of the tick bite.   Tender, swollen lymph glands.   Diarrhea.   Weight loss.   Cough.   Fatigue.   Muscle, joint, or bone pain.   Abdominal pain.   Headache.   Lethargy or a change in your level of consciousness.  Difficulty walking or moving your legs.   Numbness in the legs.   Paralysis.  Shortness of breath.   Confusion.   Repeated vomiting.  Document Released: 10/24/2000 Document Revised: 08/17/2013 Document Reviewed: 04/06/2013 [facility] Patient Information 2014 [place].    Standley Brooking. Claudio Mondry M.D.  Pre visit review using our clinic review tool, if applicable. No additional management support is needed unless otherwise documented below in the visit note.

## 2014-02-16 NOTE — Patient Instructions (Addendum)
ncertain if this is a deer tick or wood tick etc .  Because of the rash   This could be just a local reaction   Contact us if alarm issues   Tick Bite Information Ticks are insects that attach themselves to the skin and draw blood for food. There are various types of ticks. Common types include wood ticks and deer ticks. Most ticks live in shrubs and grassy areas. Ticks can climb onto your body when you make contact with leaves or grass where the tick is waiting. The most common places on the body for ticks to attach themselves are the scalp, neck, armpits, waist, and groin. Most tick bites are harmless, but sometimes ticks carry germs that cause diseases. These germs can be spread to a person during the tick's feeding process. The chance of a disease spreading through a tick bite depends on:   The type of tick.  Time of year.   How long the tick is attached.   Geographic location.  HOW CAN YOU PREVENT TICK BITES? Take these steps to help prevent tick bites when you are outdoors:  Wear protective clothing. Long sleeves and long pants are best.   Wear white clothes so you can see ticks more easily.  Tuck your pant legs into your socks.   If walking on a trail, stay in the middle of the trail to avoid brushing against bushes.  Avoid walking through areas with long grass.  Put insect repellent on all exposed skin and along boot tops, pant legs, and sleeve cuffs.   Check clothing, hair, and skin repeatedly and before going inside.   Brush off any ticks that are not attached.  Take a shower or bath as soon as possible after being outdoors.  WHAT IS THE PROPER WAY TO REMOVE A TICK? Ticks should be removed as soon as possible to help prevent diseases caused by tick bites. 1. If latex gloves are available, put them on before trying to remove a tick.  2. Using fine-point tweezers, grasp the tick as close to the skin as possible. You may also use curved forceps or a tick  removal tool. Grasp the tick as close to its head as possible. Avoid grasping the tick on its body. 3. Pull gently with steady upward pressure until the tick lets go. Do not twist the tick or jerk it suddenly. This may break off the tick's head or mouth parts. 4. Do not squeeze or crush the tick's body. This could force disease-carrying fluids from the tick into your body.  5. After the tick is removed, wash the bite area and your hands with soap and water or other disinfectant such as alcohol. 6. Apply a small amount of antiseptic cream or ointment to the bite site.  7. Wash and disinfect any instruments that were used.  Do not try to remove a tick by applying a hot match, petroleum jelly, or fingernail polish to the tick. These methods do not work and may increase the chances of disease being spread from the tick bite.  WHEN SHOULD YOU SEEK MEDICAL CARE? Contact your health care provider if you are unable to remove a tick from your skin or if a part of the tick breaks off and is stuck in the skin.  After a tick bite, you need to be aware of signs and symptoms that could be related to diseases spread by ticks. Contact your health care provider if you develop any of the following in the  days or weeks after the tick bite:  Unexplained fever.  Rash. A circular rash that appears days or weeks after the tick bite may indicate the possibility of Lyme disease. The rash may resemble a target with a bull's-eye and may occur at a different part of your body than the tick bite.  Redness and swelling in the area of the tick bite.   Tender, swollen lymph glands.   Diarrhea.   Weight loss.   Cough.   Fatigue.   Muscle, joint, or bone pain.   Abdominal pain.   Headache.   Lethargy or a change in your level of consciousness.  Difficulty walking or moving your legs.   Numbness in the legs.   Paralysis.  Shortness of breath.   Confusion.   Repeated vomiting.  Document  Released: 10/24/2000 Document Revised: 08/17/2013 Document Reviewed: 04/06/2013 [facility] Patient Information 2014 [place].

## 2014-05-16 ENCOUNTER — Other Ambulatory Visit: Payer: Self-pay | Admitting: Family Medicine

## 2014-06-06 ENCOUNTER — Ambulatory Visit: Payer: Self-pay | Admitting: Podiatry

## 2014-06-13 ENCOUNTER — Ambulatory Visit: Payer: Self-pay

## 2014-06-13 ENCOUNTER — Ambulatory Visit (INDEPENDENT_AMBULATORY_CARE_PROVIDER_SITE_OTHER): Payer: 59 | Admitting: Podiatry

## 2014-06-13 ENCOUNTER — Encounter: Payer: Self-pay | Admitting: Podiatry

## 2014-06-13 VITALS — BP 151/72 | HR 88 | Resp 12

## 2014-06-13 DIAGNOSIS — R52 Pain, unspecified: Secondary | ICD-10-CM

## 2014-06-13 DIAGNOSIS — M7660 Achilles tendinitis, unspecified leg: Secondary | ICD-10-CM

## 2014-06-13 DIAGNOSIS — M7730 Calcaneal spur, unspecified foot: Secondary | ICD-10-CM

## 2014-06-13 DIAGNOSIS — M7731 Calcaneal spur, right foot: Secondary | ICD-10-CM

## 2014-06-13 MED ORDER — TRIAMCINOLONE ACETONIDE 10 MG/ML IJ SUSP
10.0000 mg | Freq: Once | INTRAMUSCULAR | Status: AC
  Administered 2014-06-13: 10 mg

## 2014-06-13 NOTE — Progress Notes (Signed)
Subjective:     Patient ID: Alicia Mcbride, female   DOB: [birth year], 63 y.o.   MRN: [id number]  Foot Pain   patient presents stating that the back of my right heel has been hurting me for about 6 months and has gotten worse over the last 3 months. states it's affecting activity levels and exercise and been able to wear shoes   Review of Systems  All other systems reviewed and are negative.      Objective:   Physical Exam  Nursing note and vitals reviewed. Constitutional: She is oriented to person, place, and time.  Cardiovascular: Intact distal pulses.   Musculoskeletal: Normal range of motion.  Neurological: She is oriented to person, place, and time.  Skin: Skin is warm.   neurovascular status is intact with muscle strength adequate and range of motion of the subtalar and midtarsal joint within normal limits. Patient's found to have discomfort in the posterior heel region lateral side right with inflammation and fluid buildup and obvious enlargement of the bone structure. 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 Today I do think that injection could be helpful but I did explain the risk to patient. She wants this done and I carefully injected the lateral side of the Achilles 3 mg dexamethasone Kenalog 5 mg Xylocaine and applied air fracture walker to completely immobilize and placed on diclofenac 75 mg twice a day. Reappoint her recheck in 3 weeks

## 2014-06-13 NOTE — Progress Notes (Signed)
   Subjective:    Patient ID: Alicia Mcbride, female    DOB: 01/22/1951, 63 y.o.   MRN: [id number]  HPI  PT STATED RT FOOT BACK OF THE FOOT IS PAINFUL AND HAVE BURNING SENSATION FOR 3 MONTHS. THE FOOT GET WORSE. THE FOOT GET AGGRAVATED BY WALKING AND PRESSURE. TRIED NO TREATMENT.    Review of Systems  Musculoskeletal: Positive for gait problem and joint swelling.  Neurological: Positive for numbness.  All other systems reviewed and are negative.      Objective:   Physical Exam        Assessment & Plan:

## 2014-06-13 NOTE — Patient Instructions (Signed)

## 2014-06-14 ENCOUNTER — Other Ambulatory Visit: Payer: Self-pay | Admitting: Family Medicine

## 2014-06-19 ENCOUNTER — Encounter: Payer: Self-pay | Admitting: Family Medicine

## 2014-06-19 ENCOUNTER — Ambulatory Visit (INDEPENDENT_AMBULATORY_CARE_PROVIDER_SITE_OTHER): Payer: 59 | Admitting: Family Medicine

## 2014-06-19 VITALS — BP 114/73 | HR 81 | Temp 98.7°F | Ht 67.0 in | Wt 208.0 lb

## 2014-06-19 DIAGNOSIS — E785 Hyperlipidemia, unspecified: Secondary | ICD-10-CM

## 2014-06-19 DIAGNOSIS — M545 Low back pain, unspecified: Secondary | ICD-10-CM

## 2014-06-19 DIAGNOSIS — I10 Essential (primary) hypertension: Secondary | ICD-10-CM

## 2014-06-19 MED ORDER — LOSARTAN POTASSIUM-HCTZ 100-25 MG PO TABS: ORAL_TABLET | ORAL | Status: DC

## 2014-06-19 MED ORDER — MELOXICAM 15 MG PO TABS: ORAL_TABLET | ORAL | Status: DC

## 2014-06-19 NOTE — Progress Notes (Signed)
   Subjective:    Patient ID: Alicia Mcbride, female    DOB: 1950-12-10, 63 y.o.   MRN: [id number]  HPI Here to follow up. We had recommended she start on Lipitor a year ago but she did not want to, so she has been trying diet alone. She feels well except for her arthritic pains. She recently saw Dr. Paulla Dolly for a cortisone shot on the Achilles tendon and she is wearing a cam boot. Her BP is stable.    Review of Systems  Constitutional: Negative.   Respiratory: Negative.   Cardiovascular: Negative.        Objective:   Physical Exam  Constitutional: She appears well-developed and well-nourished.  Cardiovascular: Normal rate, regular rhythm, normal heart sounds and intact distal pulses.   Pulmonary/Chest: Effort normal and breath sounds normal.          Assessment & Plan:  Set up fasting labs for this week.

## 2014-06-19 NOTE — Progress Notes (Signed)
Pre visit review using our clinic review tool, if applicable. No additional management support is needed unless otherwise documented below in the visit note. 

## 2014-06-20 ENCOUNTER — Telehealth: Payer: Self-pay | Admitting: Family Medicine

## 2014-06-20 NOTE — Telephone Encounter (Signed)
Relevant patient education mailed to patient.  

## 2014-07-04 ENCOUNTER — Ambulatory Visit (INDEPENDENT_AMBULATORY_CARE_PROVIDER_SITE_OTHER): Payer: 59 | Admitting: Podiatry

## 2014-07-04 VITALS — BP 109/75 | HR 74 | Resp 16

## 2014-07-04 DIAGNOSIS — M7660 Achilles tendinitis, unspecified leg: Secondary | ICD-10-CM

## 2014-07-04 NOTE — Patient Instructions (Signed)

## 2014-07-04 NOTE — Progress Notes (Signed)
Subjective:     Patient ID: Alicia Mcbride, female   DOB: 1951-05-18, 63 y.o.   MRN: [id number]  HPI patient states she is doing a lot better with discomfort on the medial side of the Achilles more than the lateral side at this time with slow reducing of her air fracture walker   Review of Systems     Objective:   Physical Exam Neurovascular status intact with significant diminishment of discomfort in the posterior heel lateral side with moderate discomfort of the heel medial side    Assessment:     Achilles tendinitis improving but still present more on the medial side we did not treat it previous visit    Plan:     Reviewed condition and at this time advised on aggressive physical therapy and gradual reduction of the boot over the next 4 weeks. Reappoint for is to recheck if symptoms persist we may need to consider medial injection

## 2014-09-25 ENCOUNTER — Encounter: Payer: Self-pay | Admitting: Family Medicine

## 2014-09-25 ENCOUNTER — Ambulatory Visit (INDEPENDENT_AMBULATORY_CARE_PROVIDER_SITE_OTHER): Payer: 59 | Admitting: Family Medicine

## 2014-09-25 VITALS — BP 110/74 | HR 78 | Temp 97.5°F | Ht 67.0 in | Wt 218.5 lb

## 2014-09-25 DIAGNOSIS — H6122 Impacted cerumen, left ear: Secondary | ICD-10-CM

## 2014-09-25 NOTE — Progress Notes (Signed)
Pre visit review using our clinic review tool, if applicable. No additional management support is needed unless otherwise documented below in the visit note. 

## 2014-09-25 NOTE — Progress Notes (Signed)
  HPI:  Ear Clogged: -L ear -stopped up -[facility] and told she had a lot of wax and tried to get it out with a water pick and gave her drops and told her to try to get it out after the drops -reports decreased hearing in this ear -denies: pain, fevers, draining, worsening  ROS: See pertinent positives and negatives per HPI.  Past Medical History  Diagnosis Date  . Hypertension   . Allergy   . Gynecological examination     sees Dr. Alden Hipp    Past Surgical History  Procedure Laterality Date  . Tubal ligation  1985  . Tonsillectomy    . Colonoscopy  April 2007    per Dr. Olevia Perches, polyp, repeat in 5 yrs     Family History  Problem Relation Age of Onset  . Hypertension Mother   . Thyroid disease Father     History   Social History  . Marital Status: Single    Spouse Name: N/A    Number of Children: N/A  . Years of Education: N/A   Social History Main Topics  . Smoking status: Never Smoker   . Smokeless tobacco: Never Used  . Alcohol Use: Yes     Comment: couple of glasses of wine each day  . Drug Use: No  . Sexual Activity: None   Other Topics Concern  . None   Social History Narrative    Current outpatient prescriptions: losartan-hydrochlorothiazide (HYZAAR) 100-25 MG per tablet, TAKE ONE TABLET BY MOUTH EVERY DAY, Disp: 90 tablet, Rfl: 3;  meloxicam (MOBIC) 15 MG tablet, TAKE ONE TABLET BY MOUTH EVERY DAY, Disp: 90 tablet, Rfl: 3  EXAM:  Filed Vitals:   09/25/14 1512  BP: 110/74  Pulse: 78  Temp: 97.5 F (36.4 C)    Body mass index is 34.21 kg/(m^2).  GENERAL: vitals reviewed and listed above, alert, oriented, appears well hydrated and in no acute distress  HEENT: atraumatic, conjunttiva clear, no obvious abnormalities on inspection of external nose and ears, impacted ear wax in L ear canal - after ear lavage ear canal clear except for small piece of wax preventive visualization of small portion of anteroinf TM  but rest of TM normal  NECK: no obvious masses on inspection  LUNGS: clear to auscultation bilaterally, no wheezes, rales or rhonchi, good air movement  CV: HRRR, no peripheral edema  MS: moves all extremities without noticeable abnormality  PSYCH: pleasant and cooperative, no obvious depression or anxiety  ASSESSMENT AND PLAN:  Discussed the following assessment and plan:  Cerumen impaction, left  -she has impacted wax deep in her ear canal, discussed options and she decided to proceed with lavage of her ear with my assistant after discussion of risks -reports hearing improve after removal of wax -Patient advised to return or notify a doctor immediately if symptoms worsen or persist or new concerns arise.  There are no Patient Instructions on file for this visit.   Colin Benton R.

## 2014-11-30 ENCOUNTER — Other Ambulatory Visit (INDEPENDENT_AMBULATORY_CARE_PROVIDER_SITE_OTHER): Payer: 59

## 2014-11-30 DIAGNOSIS — I10 Essential (primary) hypertension: Secondary | ICD-10-CM

## 2014-11-30 LAB — LIPID PANEL
CHOL/HDL RATIO: 4
Cholesterol: 222 mg/dL — ABNORMAL HIGH (ref 0–200)
HDL: 59.8 mg/dL (ref >39.00)
LDL Cholesterol: 123 mg/dL — ABNORMAL HIGH (ref 0–99)
NonHDL: 162.2
Triglycerides: 194 mg/dL — ABNORMAL HIGH (ref 0.0–149.0)
VLDL: 38.8 mg/dL (ref 0.0–40.0)

## 2014-11-30 LAB — BASIC METABOLIC PANEL
BUN: 18 mg/dL (ref 6–23)
CALCIUM: 9.3 mg/dL (ref 8.4–10.5)
CHLORIDE: 100 meq/L (ref 96–112)
CO2: 29 meq/L (ref 19–32)
CREATININE: 0.64 mg/dL (ref 0.40–1.20)
GFR: 99.9 mL/min (ref >60.00)
GLUCOSE: 98 mg/dL (ref 70–99)
Potassium: 3.7 mEq/L (ref 3.5–5.1)
Sodium: 137 mEq/L (ref 135–145)

## 2014-11-30 LAB — HEPATIC FUNCTION PANEL
ALBUMIN: 4.3 g/dL (ref 3.5–5.2)
ALK PHOS: 79 U/L (ref 39–117)
ALT: 22 U/L (ref 0–35)
AST: 19 U/L (ref 0–37)
BILIRUBIN TOTAL: 0.8 mg/dL (ref 0.2–1.2)
Bilirubin, Direct: 0.1 mg/dL (ref 0.0–0.3)
Total Protein: 7.1 g/dL (ref 6.0–8.3)

## 2014-11-30 LAB — CBC WITH DIFFERENTIAL/PLATELET
BASOS ABS: 0 10*3/uL (ref 0.0–0.1)
Basophils Relative: 0.4 % (ref 0.0–3.0)
EOS ABS: 0.3 10*3/uL (ref 0.0–0.7)
Eosinophils Relative: 2.9 % (ref 0.0–5.0)
HEMATOCRIT: 42.9 % (ref 36.0–46.0)
Hemoglobin: 14.9 g/dL (ref 12.0–15.0)
Lymphocytes Relative: 27 % (ref 12.0–46.0)
Lymphs Abs: 2.6 10*3/uL (ref 0.7–4.0)
MCHC: 34.6 g/dL (ref 30.0–36.0)
MCV: 85.8 fl (ref 78.0–100.0)
Monocytes Absolute: 0.6 10*3/uL (ref 0.1–1.0)
Monocytes Relative: 6.1 % (ref 3.0–12.0)
NEUTROS PCT: 63.6 % (ref 43.0–77.0)
Neutro Abs: 6 10*3/uL (ref 1.4–7.7)
Platelets: 335 10*3/uL (ref 150.0–400.0)
RBC: 5 Mil/uL (ref 3.87–5.11)
RDW: 13.1 % (ref 11.5–15.5)
WBC: 9.5 10*3/uL (ref 4.0–10.5)

## 2014-11-30 LAB — POCT URINALYSIS DIPSTICK
Bilirubin, UA: NEGATIVE
Glucose, UA: NEGATIVE
Ketones, UA: NEGATIVE
Nitrite, UA: NEGATIVE
Protein, UA: NEGATIVE
SPEC GRAV UA: 1.015
UROBILINOGEN UA: 0.2
pH, UA: 6

## 2014-11-30 LAB — TSH: TSH: 0.73 u[IU]/mL (ref 0.35–4.50)

## 2014-12-01 ENCOUNTER — Other Ambulatory Visit: Payer: Self-pay

## 2014-12-12 ENCOUNTER — Encounter: Payer: Self-pay | Admitting: Family Medicine

## 2014-12-12 ENCOUNTER — Ambulatory Visit (INDEPENDENT_AMBULATORY_CARE_PROVIDER_SITE_OTHER): Payer: 59 | Admitting: Family Medicine

## 2014-12-12 VITALS — BP 113/77 | HR 109 | Temp 98.2°F | Ht 67.0 in | Wt 220.0 lb

## 2014-12-12 DIAGNOSIS — Z0000 Encounter for general adult medical examination without abnormal findings: Secondary | ICD-10-CM

## 2014-12-12 MED ORDER — KETOCONAZOLE 2 % EX CREA: 1.0000 "application " | TOPICAL_CREAM | Freq: Two times a day (BID) | CUTANEOUS | Status: DC

## 2014-12-12 NOTE — Progress Notes (Signed)
Pre visit review using our clinic review tool, if applicable. No additional management support is needed unless otherwise documented below in the visit note. 

## 2014-12-12 NOTE — Progress Notes (Signed)
   Subjective:    Patient ID: Alicia Mcbride, female    DOB: 12-May-1951, 64 y.o.   MRN: [id number]  HPI 64 yr old female for a cpx. She feels well. She asks me to check a lump on the left lower leg that she noticed a few months ago. It does not bother her. Also she has a patch of red skin in the right groin which is asymptomatic.    Review of Systems  Constitutional: Negative.   HENT: Negative.   Eyes: Negative.   Respiratory: Negative.   Cardiovascular: Negative.   Gastrointestinal: Negative.   Genitourinary: Negative for dysuria, urgency, frequency, hematuria, flank pain, decreased urine volume, enuresis, difficulty urinating, pelvic pain and dyspareunia.  Musculoskeletal: Negative.   Skin: Negative.   Neurological: Negative.   Psychiatric/Behavioral: Negative.        Objective:   Physical Exam  Constitutional: She is oriented to person, place, and time. She appears well-developed and well-nourished. No distress.  HENT:  Head: Normocephalic and atraumatic.  Right Ear: External ear normal.  Left Ear: External ear normal.  Nose: Nose normal.  Mouth/Throat: Oropharynx is clear and moist. No oropharyngeal exudate.  Eyes: Conjunctivae and EOM are normal. Pupils are equal, round, and reactive to light. No scleral icterus.  Neck: Normal range of motion. Neck supple. No JVD present. No thyromegaly present.  Cardiovascular: Normal rate, regular rhythm, normal heart sounds and intact distal pulses.  Exam reveals no gallop and no friction rub.   No murmur heard. EKG normal   Pulmonary/Chest: Effort normal and breath sounds normal. No respiratory distress. She has no wheezes. She has no rales. She exhibits no tenderness.  Abdominal: Soft. Bowel sounds are normal. She exhibits no distension and no mass. There is no tenderness. There is no rebound and no guarding.  Musculoskeletal: Normal range of motion. She exhibits no edema or tenderness.  Lymphadenopathy:    She has no cervical  adenopathy.  Neurological: She is alert and oriented to person, place, and time. She has normal reflexes. No cranial nerve deficit. She exhibits normal muscle tone. Coordination normal.  Skin: Skin is warm and dry. No erythema.  Patch of macular erythema in the right groin. There is a firm mobile lump under the skin over the left shin  Psychiatric: She has a normal mood and affect. Her behavior is normal. Judgment and thought content normal.          Assessment & Plan:  Well exam. She has a lipoma on the leg and she was reassured that this is benign. Treat the Candida with ketoconazole.

## 2015-07-11 ENCOUNTER — Other Ambulatory Visit: Payer: Self-pay | Admitting: Family Medicine

## 2015-09-17 ENCOUNTER — Other Ambulatory Visit: Payer: Self-pay | Admitting: Family Medicine

## 2016-01-09 ENCOUNTER — Encounter: Payer: Self-pay | Admitting: Gastroenterology

## 2016-02-07 ENCOUNTER — Other Ambulatory Visit: Payer: Self-pay | Admitting: Obstetrics & Gynecology

## 2016-02-07 DIAGNOSIS — R928 Other abnormal and inconclusive findings on diagnostic imaging of breast: Secondary | ICD-10-CM

## 2016-02-13 ENCOUNTER — Ambulatory Visit
Admission: RE | Admit: 2016-02-13 | Discharge: 2016-02-13 | Disposition: A | Discharge status: Home / Self Care | Payer: No Typology Code available for payment source | Source: Ambulatory Visit | Attending: Obstetrics & Gynecology | Admitting: Obstetrics & Gynecology

## 2016-02-13 ENCOUNTER — Ambulatory Visit
Admission: RE | Admit: 2016-02-13 | Discharge: 2016-02-13 | Disposition: A | Discharge status: Home / Self Care | Payer: 59 | Source: Ambulatory Visit | Attending: Obstetrics & Gynecology | Admitting: Obstetrics & Gynecology

## 2016-02-13 DIAGNOSIS — R928 Other abnormal and inconclusive findings on diagnostic imaging of breast: Secondary | ICD-10-CM

## 2016-04-02 ENCOUNTER — Other Ambulatory Visit: Payer: Self-pay | Admitting: Family Medicine

## 2016-04-02 NOTE — Telephone Encounter (Signed)
Patient last seen 12/12/14 - ok to refill this medication?

## 2016-05-26 ENCOUNTER — Other Ambulatory Visit: Payer: Self-pay | Admitting: Family Medicine

## 2016-05-27 NOTE — Telephone Encounter (Signed)
Patient last seen 12/12/14 - last refill 07/11/15 for #90 with 2 refills. Ok to refill?

## 2017-05-11 ENCOUNTER — Other Ambulatory Visit: Payer: Self-pay | Admitting: Family Medicine

## 2017-05-21 ENCOUNTER — Other Ambulatory Visit: Payer: Self-pay | Admitting: Family Medicine

## 2017-05-25 ENCOUNTER — Telehealth: Payer: Self-pay | Admitting: Family Medicine

## 2017-05-25 NOTE — Telephone Encounter (Signed)
No more refills without an OV

## 2017-05-25 NOTE — Telephone Encounter (Signed)
I didn't see pt not coming in for office visit until December, just saw 7/18, was thinking this week. It has been a long time since patient was seen, only a provider can approve.

## 2017-05-25 NOTE — Telephone Encounter (Signed)
Pt was last seen 2016 and will be on medicare in dec 2018. Pt has sch an appt for dec 7-18. Pt would like  Refills on losartan-hctz 100-25 mg #30. [facility] [place]

## 2017-05-25 NOTE — Telephone Encounter (Signed)
I left a voice message with below information. 

## 2017-05-25 NOTE — Telephone Encounter (Signed)
This will need to be approved during office visit.

## 2017-06-24 ENCOUNTER — Ambulatory Visit (INDEPENDENT_AMBULATORY_CARE_PROVIDER_SITE_OTHER): Payer: No Typology Code available for payment source | Admitting: Family Medicine

## 2017-06-24 ENCOUNTER — Encounter: Payer: Self-pay | Admitting: Family Medicine

## 2017-06-24 VITALS — BP 116/80 | HR 88 | Temp 98.3°F | Ht 67.0 in | Wt 192.0 lb

## 2017-06-24 DIAGNOSIS — M170 Bilateral primary osteoarthritis of knee: Secondary | ICD-10-CM | POA: Diagnosis not present

## 2017-06-24 DIAGNOSIS — E782 Mixed hyperlipidemia: Secondary | ICD-10-CM | POA: Diagnosis not present

## 2017-06-24 DIAGNOSIS — I10 Essential (primary) hypertension: Secondary | ICD-10-CM

## 2017-06-24 MED ORDER — LOSARTAN POTASSIUM-HCTZ 100-25 MG PO TABS: 1.0000 | ORAL_TABLET | Freq: Every day | ORAL | 11 refills | Status: DC

## 2017-06-24 NOTE — Patient Instructions (Signed)
WE NOW OFFER   [facility] FAST TRACK!!!  SAME DAY Appointments for ACUTE CARE  Such as: Sprains, Injuries, cuts, abrasions, rashes, muscle pain, joint pain, back pain Colds, flu, sore throats, headache, allergies, cough, fever  Ear pain, sinus and eye infections Abdominal pain, nausea, vomiting, diarrhea, upset stomach Animal/insect bites  3 Easy Ways to Schedule: Walk-In Scheduling Call in scheduling Mychart Sign-up: [email or web address]         

## 2017-06-24 NOTE — Progress Notes (Signed)
   Subjective:    Patient ID: Alicia Mcbride, female    DOB: 02-Apr-1951, 66 y.o.   MRN: [id number]  HPI Here to follow up. She is seeing Dr. Wynelle Link for bilateral knee pain and expects to have bilateral replacement surgery within the next year. She wears adult diapers for urinary incontinence. This sounds like urge incontinence by her description. Her BP is stable.    Review of Systems  Constitutional: Negative.   Respiratory: Negative.   Cardiovascular: Negative.   Musculoskeletal: Positive for arthralgias.  Neurological: Negative.        Objective:   Physical Exam  Constitutional: She is oriented to person, place, and time. She appears well-developed and well-nourished.  Cardiovascular: Normal rate, regular rhythm, normal heart sounds and intact distal pulses.   Pulmonary/Chest: Effort normal and breath sounds normal. No respiratory distress. She has no wheezes. She has no rales.  Neurological: She is alert and oriented to person, place, and time.          Assessment & Plan:  Her HTN is stable. Knee pain is managed by Dr. Wynelle Link. I offered to try her on some medications for the incontinence but she prefers to wear the diapers instead. Set up fasting labs soon.  Alicia Penna, MD

## 2017-10-14 DIAGNOSIS — Z01419 Encounter for gynecological examination (general) (routine) without abnormal findings: Secondary | ICD-10-CM | POA: Diagnosis not present

## 2017-10-14 DIAGNOSIS — N3946 Mixed incontinence: Secondary | ICD-10-CM | POA: Diagnosis not present

## 2017-10-14 DIAGNOSIS — M170 Bilateral primary osteoarthritis of knee: Secondary | ICD-10-CM | POA: Diagnosis not present

## 2017-10-14 DIAGNOSIS — Z1231 Encounter for screening mammogram for malignant neoplasm of breast: Secondary | ICD-10-CM | POA: Diagnosis not present

## 2017-10-14 DIAGNOSIS — Z124 Encounter for screening for malignant neoplasm of cervix: Secondary | ICD-10-CM | POA: Diagnosis not present

## 2017-10-16 ENCOUNTER — Ambulatory Visit: Payer: 59 | Admitting: Family Medicine

## 2017-10-18 ENCOUNTER — Ambulatory Visit: Payer: Self-pay | Admitting: Orthopedic Surgery

## 2017-10-18 NOTE — H&P (Signed)
Alicia Mcbride DOB: October 02, 1951 Married / Language: English / Race: White Female Date of Admission:  11/11/2017 CC:  Bilateral knee pain History of Present Illness  The patient is a 66 year old female who comes in  for a preoperative History and Physical. The patient is scheduled for a bilateral total knee arthroplasty to be performed by Dr. Dione Plover. Aluisio, MD at [facility] on 11-11-2017. The patient is a 66 year old female who presented for follow up of their knee. The patient is being followed for their bilateral knee pain and osteoarthritis. They are now month(s) out from cortisone injections. Symptoms reported include: pain, swelling and instability (slightly). The patient feels that they are doing poorly. Current treatment includes: NSAIDs (Advil prn). The patient has not gotten any relief of their symptoms with Cortisone injections (last injections did not help at all). She is at this stage she is ready to go ahead and get these knees fixed. Both are hurting equally. Both causing significant functional limitations. She has had cortisone injections and viscous supplements without benefit. She at the point where she is ready to go ahead and get the knees replaced. We had briefly discussed this before but she is ready to go ahead and proceed at this time. They have been treated conservatively in the past for the above stated problem and despite conservative measures, they continue to have progressive pain and severe functional limitations and dysfunction. They have failed non-operative management including home exercise, medications, and injections. It is felt that they would benefit from undergoing bilateral total joint replacements. Risks and benefits of the procedure have been discussed with the patient and they elect to proceed with surgery. There are no active contraindications to surgery such as ongoing infection or rapidly progressive neurological disease.   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 Heart Disease  Maternal Grandfather.  Social History  No history of drug/alcohol rehab  Not under pain contract  Marital status  married Tobacco use  Never smoker. 12/28/2013 Number of flights of stairs before winded  1 Current drinker  12/28/2013: Currently drinks wine 5-7 times per week Children  2 Current work status  working full time Living situation  live with spouse Exercise  Exercises rarely  Medication History  Multivitamin (Oral) Active. ([place] [employer] vitamins) Losartan Potassium (Oral) Specific strength unknown - Active.  Past Surgical History  Tubal Ligation  Date: 55. Tonsillectomy  Date: 1963.   Review of Systems  General Not Present- Chills, Fatigue, Fever, Memory Loss, Night Sweats, Weight Gain and Weight Loss. Skin Not Present- Eczema, Hives, Itching, Lesions and Rash. HEENT Not Present- Dentures, Double Vision, Headache, Hearing Loss, Tinnitus and Visual Loss. Respiratory Not Present- Allergies, Chronic Cough, Coughing up blood, Shortness of breath at rest and Shortness of breath with exertion. Cardiovascular Not Present- Chest Pain, Difficulty Breathing Lying Down, Murmur, Palpitations, Racing/skipping heartbeats and Swelling. Gastrointestinal Not Present- Abdominal Pain, Bloody Stool, Constipation, Diarrhea, Difficulty Swallowing, Heartburn, Jaundice, Loss of appetitie, Nausea and Vomiting. Female Genitourinary Not Present- Blood in Urine, Discharge, Flank Pain, Incontinence, Painful Urination, Urgency, Urinary frequency, Urinary Retention, Urinating at Night and Weak urinary  stream. Musculoskeletal Present- Joint Pain. Not Present- Back Pain,  Joint Swelling, Morning Stiffness, Muscle Pain, Muscle Weakness and Spasms. Neurological Not Present- Blackout spells, Difficulty with balance, Dizziness, Paralysis, Tremor and Weakness. Psychiatric Not Present- Insomnia.  Vitals  Weight: 190 lb Height: 67.5in Weight was reported by patient. Height was reported by patient. Body Surface Area: 1.99 m Body Mass Index: 29.32 kg/m  Pulse: 84 (Regular)  BP: 134/78 (Sitting, Left Arm, Standard)       Physical Exam  General Mental Status -Alert, cooperative and good historian. General Appearance-pleasant, Not in acute distress. Orientation-Oriented X3. Build & Nutrition-Well nourished and Well developed.  Head and Neck Head-normocephalic, atraumatic . Neck Global Assessment - supple, no bruit auscultated on the right, no bruit auscultated on the left.  Eye Pupil - Bilateral-Regular and Round. Motion - Bilateral-EOMI.  Chest and Lung Exam Auscultation Breath sounds - clear at anterior chest wall and clear at posterior chest wall. Adventitious sounds - No Adventitious sounds.  Cardiovascular Auscultation Rhythm - Regular rate and rhythm. Heart Sounds - S1 WNL and S2 WNL. Murmurs & Other Heart Sounds - Auscultation of the heart reveals - No Murmurs.  Abdomen Palpation/Percussion Tenderness - Abdomen is non-tender to palpation. Rigidity (guarding) - Abdomen is soft. Auscultation Auscultation of the abdomen reveals - Bowel sounds normal.  Female Genitourinary Note: Not done, not pertinent to present illness   Musculoskeletal Note: Her LEFT knee shows no effusion. Range of motion 5-125. His marked crepitus on range of motion with tenderness medial greater than lateral and no instability noted. RIGHT knee no effusion range 5-125. Marked crepitus on range of motion. SHe has some tenderness medial greater lateral with no  instability.She has a significantly antalgic gait pattern.  Review of her x-rays show bone-on-bone changes medial patellofemoral both knees.     Assessment & Plan  Primary osteoarthritis of right knee (M17.11) Primary osteoarthritis of left knee (M17.12)  Note:Surgical Plans: Bilateral Total Knee Replacements  Disposition: Inpatient Rehab at [facility] versus [place] near her family  PCP: Dr. Delma Freeze - pending  IV TXA  Anesthesia Issues: None  Patient was instructed on what medications to stop prior to surgery.  Signed electronically by Joelene Millin, III PA-C

## 2017-10-20 ENCOUNTER — Encounter: Payer: No Typology Code available for payment source | Admitting: Family Medicine

## 2017-10-23 NOTE — Progress Notes (Signed)
Need orders in epic for 1-2 surgery pre op is 12-26

## 2017-10-27 ENCOUNTER — Encounter: Payer: Self-pay | Admitting: Family Medicine

## 2017-10-27 ENCOUNTER — Ambulatory Visit: Payer: Self-pay | Admitting: Orthopedic Surgery

## 2017-10-27 ENCOUNTER — Ambulatory Visit (INDEPENDENT_AMBULATORY_CARE_PROVIDER_SITE_OTHER): Payer: PPO | Admitting: Family Medicine

## 2017-10-27 VITALS — BP 116/72 | HR 86 | Temp 98.0°F | Wt 194.2 lb

## 2017-10-27 DIAGNOSIS — Z0000 Encounter for general adult medical examination without abnormal findings: Secondary | ICD-10-CM | POA: Diagnosis not present

## 2017-10-27 DIAGNOSIS — Z01818 Encounter for other preprocedural examination: Secondary | ICD-10-CM | POA: Diagnosis not present

## 2017-10-27 LAB — POC URINALSYSI DIPSTICK (AUTOMATED)
Bilirubin, UA: NEGATIVE
GLUCOSE UA: NEGATIVE
Ketones, UA: NEGATIVE
LEUKOCYTES UA: NEGATIVE
NITRITE UA: NEGATIVE
PROTEIN UA: NEGATIVE
UROBILINOGEN UA: 0.2 U/dL
pH, UA: 6 (ref 5.0–8.0)

## 2017-10-27 NOTE — Progress Notes (Signed)
   Subjective:    Patient ID: Alicia Mcbride, female    DOB: June 13, 1951, 66 y.o.   MRN: [id number]  HPI Here for a well exam and for a preoperative clearance. She is scheduled for a bilateral knee replacement surgery per Dr. Wynelle Link on 11-11-17. Other than knee pain she is doing well.    Review of Systems  Constitutional: Negative.   HENT: Negative.   Eyes: Negative.   Respiratory: Negative.   Cardiovascular: Negative.   Gastrointestinal: Negative.   Genitourinary: Negative for decreased urine volume, difficulty urinating, dyspareunia, dysuria, enuresis, flank pain, frequency, hematuria, pelvic pain and urgency.  Musculoskeletal: Positive for arthralgias. Negative for back pain, joint swelling, myalgias, neck pain and neck stiffness.  Skin: Negative.   Neurological: Negative.   Psychiatric/Behavioral: Negative.        Objective:   Physical Exam  Constitutional: She is oriented to person, place, and time. She appears well-developed and well-nourished. No distress.  HENT:  Head: Normocephalic and atraumatic.  Right Ear: External ear normal.  Left Ear: External ear normal.  Nose: Nose normal.  Mouth/Throat: Oropharynx is clear and moist. No oropharyngeal exudate.  Eyes: Conjunctivae and EOM are normal. Pupils are equal, round, and reactive to light. No scleral icterus.  Neck: Normal range of motion. Neck supple. No JVD present. No thyromegaly present.  Cardiovascular: Normal rate, regular rhythm, normal heart sounds and intact distal pulses. Exam reveals no gallop and no friction rub.  No murmur heard. Her EKG is within normal limits   Pulmonary/Chest: Effort normal and breath sounds normal. No respiratory distress. She has no wheezes. She has no rales. She exhibits no tenderness.  Abdominal: Soft. Bowel sounds are normal. She exhibits no distension and no mass. There is no tenderness. There is no rebound and no guarding.  Musculoskeletal: Normal range of motion. She exhibits no  edema or tenderness.  Lymphadenopathy:    She has no cervical adenopathy.  Neurological: She is alert and oriented to person, place, and time. She has normal reflexes. No cranial nerve deficit. She exhibits normal muscle tone. Coordination normal.  Skin: Skin is warm and dry. No rash noted. No erythema.  Psychiatric: She has a normal mood and affect. Her behavior is normal. Judgment and thought content normal.          Assessment & Plan:  Well exam. We discussed diet and exercise. Get fasting labs today. Pending her lab results, she is cleared for surgery.  [name], MD

## 2017-10-28 LAB — LIPID PANEL
CHOLESTEROL: 240 mg/dL — AB (ref 0–200)
HDL: 71 mg/dL (ref >39.00)
LDL CALC: 146 mg/dL — AB (ref 0–99)
NonHDL: 168.92
Total CHOL/HDL Ratio: 3
Triglycerides: 116 mg/dL (ref 0.0–149.0)
VLDL: 23.2 mg/dL (ref 0.0–40.0)

## 2017-10-28 LAB — TSH: TSH: 0.39 u[IU]/mL (ref 0.35–4.50)

## 2017-10-28 LAB — BASIC METABOLIC PANEL
BUN: 18 mg/dL (ref 6–23)
CALCIUM: 9.6 mg/dL (ref 8.4–10.5)
CO2: 29 meq/L (ref 19–32)
CREATININE: 0.59 mg/dL (ref 0.40–1.20)
Chloride: 99 mEq/L (ref 96–112)
GFR: 108.71 mL/min (ref >60.00)
GLUCOSE: 78 mg/dL (ref 70–99)
Potassium: 3.7 mEq/L (ref 3.5–5.1)
Sodium: 140 mEq/L (ref 135–145)

## 2017-10-28 LAB — HEPATIC FUNCTION PANEL
ALBUMIN: 4.5 g/dL (ref 3.5–5.2)
ALT: 13 U/L (ref 0–35)
AST: 13 U/L (ref 0–37)
Alkaline Phosphatase: 84 U/L (ref 39–117)
Bilirubin, Direct: 0.1 mg/dL (ref 0.0–0.3)
TOTAL PROTEIN: 7.2 g/dL (ref 6.0–8.3)
Total Bilirubin: 1 mg/dL (ref 0.2–1.2)

## 2017-10-28 LAB — CBC WITH DIFFERENTIAL/PLATELET
BASOS ABS: 0.1 10*3/uL (ref 0.0–0.1)
Basophils Relative: 0.9 % (ref 0.0–3.0)
EOS ABS: 0.1 10*3/uL (ref 0.0–0.7)
Eosinophils Relative: 1.6 % (ref 0.0–5.0)
HEMATOCRIT: 44.6 % (ref 36.0–46.0)
HEMOGLOBIN: 15 g/dL (ref 12.0–15.0)
Lymphocytes Relative: 23.5 % (ref 12.0–46.0)
Lymphs Abs: 2 10*3/uL (ref 0.7–4.0)
MCHC: 33.7 g/dL (ref 30.0–36.0)
MCV: 89.8 fl (ref 78.0–100.0)
Monocytes Absolute: 0.5 10*3/uL (ref 0.1–1.0)
Monocytes Relative: 6.4 % (ref 3.0–12.0)
Neutro Abs: 5.7 10*3/uL (ref 1.4–7.7)
Neutrophils Relative %: 67.6 % (ref 43.0–77.0)
PLATELETS: 343 10*3/uL (ref 150.0–400.0)
RBC: 4.97 Mil/uL (ref 3.87–5.11)
RDW: 13.5 % (ref 11.5–15.5)
WBC: 8.4 10*3/uL (ref 4.0–10.5)

## 2017-10-30 ENCOUNTER — Other Ambulatory Visit (HOSPITAL_COMMUNITY): Payer: Self-pay | Admitting: Emergency Medicine

## 2017-10-30 NOTE — Patient Instructions (Signed)
Alicia Mcbride  10/30/2017   Your procedure is scheduled on: 11-11-17  Report to [facility] Main  Entrance    Report to admitting at 745AM   Call this number if you have problems the morning of surgery [phone]   Remember: ONLY 1 PERSON MAY GO WITH YOU TO SHORT STAY TO GET  READY MORNING OF YOUR SURGERY.  Do not eat food or drink liquids :After Midnight.     Take these medicines the morning of surgery with A SIP OF WATER: NONE                                You may not have any metal on your body including hair pins and              piercings  Do not wear jewelry, make-up, lotions, powders or perfumes, deodorant             Do not wear nail polish.  Do not shave  48 hours prior to surgery.     Do not bring valuables to the hospital. [place].  Contacts, dentures or bridgework may not be worn into surgery.  Leave suitcase in the car. After surgery it may be brought to your room.                Please read over the following fact sheets you were given: _____________________________________________________________________             [facility] - Preparing for Surgery Before surgery, you can play an important role.  Because skin is not sterile, your skin needs to be as free of germs as possible.  You can reduce the number of germs on your skin by washing with CHG (chlorahexidine gluconate) soap before surgery.  CHG is an antiseptic cleaner which kills germs and bonds with the skin to continue killing germs even after washing. Please DO NOT use if you have an allergy to CHG or antibacterial soaps.  If your skin becomes reddened/irritated stop using the CHG and inform your nurse when you arrive at Short Stay. Do not shave (including legs and underarms) for at least 48 hours prior to the first CHG shower.  You may shave your face/neck. Please follow these instructions carefully:  1.  Shower with CHG  Soap the night before surgery and the  morning of Surgery.  2.  If you choose to wash your hair, wash your hair first as usual with your  normal  shampoo.  3.  After you shampoo, rinse your hair and body thoroughly to remove the  shampoo.                           4.  Use CHG as you would any other liquid soap.  You can apply chg directly  to the skin and wash                       Gently with a scrungie or clean washcloth.  5.  Apply the CHG Soap to your body ONLY FROM THE NECK DOWN.   Do not use on face/ open  Wound or open sores. Avoid contact with eyes, ears mouth and genitals (private parts).                       Wash face,  Genitals (private parts) with your normal soap.             6.  Wash thoroughly, paying special attention to the area where your surgery  will be performed.  7.  Thoroughly rinse your body with warm water from the neck down.  8.  DO NOT shower/wash with your normal soap after using and rinsing off  the CHG Soap.                9.  Pat yourself dry with a clean towel.            10.  Wear clean pajamas.            11.  Place clean sheets on your bed the night of your first shower and do not  sleep with pets. Day of Surgery : Do not apply any lotions/deodorants the morning of surgery.  Please wear clean clothes to the hospital/surgery center.  FAILURE TO FOLLOW THESE INSTRUCTIONS MAY RESULT IN THE CANCELLATION OF YOUR SURGERY PATIENT SIGNATURE_________________________________  NURSE SIGNATURE__________________________________  ________________________________________________________________________   Adam Phenix  An incentive spirometer is a tool that can help keep your lungs clear and active. This tool measures how well you are filling your lungs with each breath. Taking long deep breaths may help reverse or decrease the chance of developing breathing (pulmonary) problems (especially infection) following:  A long period of time  when you are unable to move or be active. BEFORE THE PROCEDURE   If the spirometer includes an indicator to show your best effort, your nurse or respiratory therapist will set it to a desired goal.  If possible, sit up straight or lean slightly forward. Try not to slouch.  Hold the incentive spirometer in an upright position. INSTRUCTIONS FOR USE  1. Sit on the edge of your bed if possible, or sit up as far as you can in bed or on a chair. 2. Hold the incentive spirometer in an upright position. 3. Breathe out normally. 4. Place the mouthpiece in your mouth and seal your lips tightly around it. 5. Breathe in slowly and as deeply as possible, raising the piston or the ball toward the top of the column. 6. Hold your breath for 3-5 seconds or for as long as possible. Allow the piston or ball to fall to the bottom of the column. 7. Remove the mouthpiece from your mouth and breathe out normally. 8. Rest for a few seconds and repeat Steps 1 through 7 at least 10 times every 1-2 hours when you are awake. Take your time and take a few normal breaths between deep breaths. 9. The spirometer may include an indicator to show your best effort. Use the indicator as a goal to work toward during each repetition. 10. After each set of 10 deep breaths, practice coughing to be sure your lungs are clear. If you have an incision (the cut made at the time of surgery), support your incision when coughing by placing a pillow or rolled up towels firmly against it. Once you are able to get out of bed, walk around indoors and cough well. You may stop using the incentive spirometer when instructed by your caregiver.  RISKS AND COMPLICATIONS  Take your time so you do not get  dizzy or light-headed.  If you are in pain, you may need to take or ask for pain medication before doing incentive spirometry. It is harder to take a deep breath if you are having pain. AFTER USE  Rest and breathe slowly and easily.  It can be  helpful to keep track of a log of your progress. Your caregiver can provide you with a simple table to help with this. If you are using the spirometer at home, follow these instructions: [place] IF:   You are having difficultly using the spirometer.  You have trouble using the spirometer as often as instructed.  Your pain medication is not giving enough relief while using the spirometer.  You develop fever of 100.5 F (38.1 C) or higher. SEEK IMMEDIATE MEDICAL CARE IF:   You cough up bloody sputum that had not been present before.  You develop fever of 102 F (38.9 C) or greater.  You develop worsening pain at or near the incision site. MAKE SURE YOU:   Understand these instructions.  Will watch your condition.  Will get help right away if you are not doing well or get worse. Document Released: 03/09/2007 Document Revised: 01/19/2012 Document Reviewed: 05/10/2007 ExitCare Patient Information 2014 ExitCare, [place].   ________________________________________________________________________  WHAT IS A BLOOD TRANSFUSION? Blood Transfusion Information  A transfusion is the replacement of blood or some of its parts. Blood is made up of multiple cells which provide different functions.  Red blood cells carry oxygen and are used for blood loss replacement.  White blood cells fight against infection.  Platelets control bleeding.  Plasma helps clot blood.  Other blood products are available for specialized needs, such as hemophilia or other clotting disorders. BEFORE THE TRANSFUSION  Who gives blood for transfusions?   Healthy volunteers who are fully evaluated to make sure their blood is safe. This is blood bank blood. Transfusion therapy is the safest it has ever been in the practice of medicine. Before blood is taken from a donor, a complete history is taken to make sure that person has no history of diseases nor engages in risky social behavior (examples are  intravenous drug use or sexual activity with multiple partners). The donor's travel history is screened to minimize risk of transmitting infections, such as malaria. The donated blood is tested for signs of infectious diseases, such as HIV and hepatitis. The blood is then tested to be sure it is compatible with you in order to minimize the chance of a transfusion reaction. If you or a relative donates blood, this is often done in anticipation of surgery and is not appropriate for emergency situations. It takes many days to process the donated blood. RISKS AND COMPLICATIONS Although transfusion therapy is very safe and saves many lives, the main dangers of transfusion include:   Getting an infectious disease.  Developing a transfusion reaction. This is an allergic reaction to something in the blood you were given. Every precaution is taken to prevent this. The decision to have a blood transfusion has been considered carefully by your caregiver before blood is given. Blood is not given unless the benefits outweigh the risks. AFTER THE TRANSFUSION  Right after receiving a blood transfusion, you will usually feel much better and more energetic. This is especially true if your red blood cells have gotten low (anemic). The transfusion raises the level of the red blood cells which carry oxygen, and this usually causes an energy increase.  The nurse administering the transfusion will  monitor you carefully for complications. HOME CARE INSTRUCTIONS  No special instructions are needed after a transfusion. You may find your energy is better. Speak with your caregiver about any limitations on activity for underlying diseases you may have. SEEK MEDICAL CARE IF:   Your condition is not improving after your transfusion.  You develop redness or irritation at the intravenous (IV) site. SEEK IMMEDIATE MEDICAL CARE IF:  Any of the following symptoms occur over the next 12 hours:  Shaking chills.  You have a  temperature by mouth above 102 F (38.9 C), not controlled by medicine.  Chest, back, or muscle pain.  People around you feel you are not acting correctly or are confused.  Shortness of breath or difficulty breathing.  Dizziness and fainting.  You get a rash or develop hives.  You have a decrease in urine output.  Your urine turns a dark color or changes to pink, red, or brown. Any of the following symptoms occur over the next 10 days:  You have a temperature by mouth above 102 F (38.9 C), not controlled by medicine.  Shortness of breath.  Weakness after normal activity.  The white part of the eye turns yellow (jaundice).  You have a decrease in the amount of urine or are urinating less often.  Your urine turns a dark color or changes to pink, red, or brown. Document Released: 10/24/2000 Document Revised: 01/19/2012 Document Reviewed: 06/12/2008 [facility] Patient Information 2014 [place], [place].  _______________________________________________________________________

## 2017-10-30 NOTE — Progress Notes (Signed)
ekg 10-27-17 epic  Cbcdiff, bmp, tsh. Lipid panel, urinalysis 10-27-17 epic

## 2017-11-03 NOTE — H&P (Signed)
Alicia Mcbride DOB: 1951-04-21 Married / Language: English / Race: White Female Date of Admission:  11/11/2017 CC:  Bilateral knee pain History of Present Illness  The patient is a 66 year old female who comes in  for a preoperative History and Physical. The patient is scheduled for a bilateral total knee arthroplasty to be performed by Dr. Dione Plover. Aluisio, MD at [facility] on 11-11-2017. The patient is a 66 year old female who presented for follow up of their knee. The patient is being followed for their bilateral knee pain and osteoarthritis. They are now month(s) out from cortisone injections. Symptoms reported include: pain, swelling and instability (slightly). The patient feels that they are doing poorly. Current treatment includes: NSAIDs (Advil prn). The patient has not gotten any relief of their symptoms with Cortisone injections (last injections did not help at all). She is at this stage she is ready to go ahead and get these knees fixed. Both are hurting equally. Both causing significant functional limitations. She has had cortisone injections and viscous supplements without benefit. She at the point where she is ready to go ahead and get the knees replaced. We had briefly discussed this before but she is ready to go ahead and proceed at this time. They have been treated conservatively in the past for the above stated problem and despite conservative measures, they continue to have progressive pain and severe functional limitations and dysfunction. They have failed non-operative management including home exercise, medications, and injections. It is felt that they would benefit from undergoing bilateral total joint replacements. Risks and benefits of the procedure have been discussed with the patient and they elect to proceed with surgery. There are no active contraindications to surgery such as ongoing infection or rapidly progressive neurological disease.   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 Heart Disease  Maternal Grandfather.  Social History  No history of drug/alcohol rehab  Not under pain contract  Marital status  married Tobacco use  Never smoker. 12/28/2013 Number of flights of stairs before winded  1 Current drinker  12/28/2013: Currently drinks wine 5-7 times per week Children  2 Current work status  working full time Living situation  live with spouse Exercise  Exercises rarely  Medication History  Multivitamin (Oral) Active. ([place] [employer] vitamins) Losartan Potassium (Oral) Specific strength unknown - Active.  Past Surgical History  Tubal Ligation  Date: 55. Tonsillectomy  Date: 1963.   Review of Systems  General Not Present- Chills, Fatigue, Fever, Memory Loss, Night Sweats, Weight Gain and Weight Loss. Skin Not Present- Eczema, Hives, Itching, Lesions and Rash. HEENT Not Present- Dentures, Double Vision, Headache, Hearing Loss, Tinnitus and Visual Loss. Respiratory Not Present- Allergies, Chronic Cough, Coughing up blood, Shortness of breath at rest and Shortness of breath with exertion. Cardiovascular Not Present- Chest Pain, Difficulty Breathing Lying Down, Murmur, Palpitations, Racing/skipping heartbeats and Swelling. Gastrointestinal Not Present- Abdominal Pain, Bloody Stool, Constipation, Diarrhea, Difficulty Swallowing, Heartburn, Jaundice, Loss of appetitie, Nausea and Vomiting. Female Genitourinary Not Present- Blood in Urine, Discharge, Flank Pain, Incontinence, Painful Urination, Urgency, Urinary frequency, Urinary Retention, Urinating at Night and Weak urinary  stream. Musculoskeletal Present- Joint Pain. Not Present- Back Pain,  Joint Swelling, Morning Stiffness, Muscle Pain, Muscle Weakness and Spasms. Neurological Not Present- Blackout spells, Difficulty with balance, Dizziness, Paralysis, Tremor and Weakness. Psychiatric Not Present- Insomnia.  Vitals  Weight: 190 lb Height: 67.5in Weight was reported by patient. Height was reported by patient. Body Surface Area: 1.99 m Body Mass Index: 29.32 kg/m  Pulse: 84 (Regular)  BP: 134/78 (Sitting, Left Arm, Standard)       Physical Exam  General Mental Status -Alert, cooperative and good historian. General Appearance-pleasant, Not in acute distress. Orientation-Oriented X3. Build & Nutrition-Well nourished and Well developed.  Head and Neck Head-normocephalic, atraumatic . Neck Global Assessment - supple, no bruit auscultated on the right, no bruit auscultated on the left.  Eye Pupil - Bilateral-Regular and Round. Motion - Bilateral-EOMI.  Chest and Lung Exam Auscultation Breath sounds - clear at anterior chest wall and clear at posterior chest wall. Adventitious sounds - No Adventitious sounds.  Cardiovascular Auscultation Rhythm - Regular rate and rhythm. Heart Sounds - S1 WNL and S2 WNL. Murmurs & Other Heart Sounds - Auscultation of the heart reveals - No Murmurs.  Abdomen Palpation/Percussion Tenderness - Abdomen is non-tender to palpation. Rigidity (guarding) - Abdomen is soft. Auscultation Auscultation of the abdomen reveals - Bowel sounds normal.  Female Genitourinary Note: Not done, not pertinent to present illness   Musculoskeletal Note: Her LEFT knee shows no effusion. Range of motion 5-125. His marked crepitus on range of motion with tenderness medial greater than lateral and no instability noted. RIGHT knee no effusion range 5-125. Marked crepitus on range of motion. SHe has some tenderness medial greater lateral with no  instability.She has a significantly antalgic gait pattern.  Review of her x-rays show bone-on-bone changes medial patellofemoral both knees.     Assessment & Plan  Primary osteoarthritis of right knee (M17.11) Primary osteoarthritis of left knee (M17.12)  Note:Surgical Plans: Bilateral Total Knee Replacements  Disposition: Inpatient Rehab at [facility] versus [place] near her family  PCP: Dr. Delma Freeze - pending  IV TXA  Anesthesia Issues: None  Patient was instructed on what medications to stop prior to surgery.  Signed electronically by Joelene Millin, III PA-C

## 2017-11-04 ENCOUNTER — Encounter (HOSPITAL_COMMUNITY): Payer: Self-pay

## 2017-11-04 ENCOUNTER — Other Ambulatory Visit: Payer: Self-pay

## 2017-11-04 ENCOUNTER — Encounter (HOSPITAL_COMMUNITY)
Admission: RE | Admit: 2017-11-04 | Discharge: 2017-11-04 | Disposition: A | Discharge status: Home / Self Care | Payer: PPO | Source: Ambulatory Visit | Attending: Orthopedic Surgery | Admitting: Orthopedic Surgery

## 2017-11-04 DIAGNOSIS — Z79899 Other long term (current) drug therapy: Secondary | ICD-10-CM | POA: Insufficient documentation

## 2017-11-04 DIAGNOSIS — M170 Bilateral primary osteoarthritis of knee: Secondary | ICD-10-CM | POA: Diagnosis not present

## 2017-11-04 DIAGNOSIS — M25561 Pain in right knee: Secondary | ICD-10-CM | POA: Diagnosis not present

## 2017-11-04 DIAGNOSIS — Z9889 Other specified postprocedural states: Secondary | ICD-10-CM | POA: Diagnosis not present

## 2017-11-04 DIAGNOSIS — Z8249 Family history of ischemic heart disease and other diseases of the circulatory system: Secondary | ICD-10-CM | POA: Insufficient documentation

## 2017-11-04 DIAGNOSIS — Z01812 Encounter for preprocedural laboratory examination: Secondary | ICD-10-CM | POA: Insufficient documentation

## 2017-11-04 DIAGNOSIS — M25562 Pain in left knee: Secondary | ICD-10-CM | POA: Insufficient documentation

## 2017-11-04 HISTORY — DX: Other specified postprocedural states: Z98.890

## 2017-11-04 HISTORY — DX: Other specified postprocedural states: R11.2

## 2017-11-04 HISTORY — DX: Nausea with vomiting, unspecified: Z98.890

## 2017-11-04 LAB — COMPREHENSIVE METABOLIC PANEL
ALK PHOS: 83 U/L (ref 38–126)
ALT: 16 U/L (ref 14–54)
ANION GAP: 8 (ref 5–15)
AST: 17 U/L (ref 15–41)
Albumin: 4.2 g/dL (ref 3.5–5.0)
BILIRUBIN TOTAL: 0.6 mg/dL (ref 0.3–1.2)
BUN: 16 mg/dL (ref 6–20)
CALCIUM: 9 mg/dL (ref 8.9–10.3)
CO2: 31 mmol/L (ref 22–32)
CREATININE: 0.61 mg/dL (ref 0.44–1.00)
Chloride: 100 mmol/L — ABNORMAL LOW (ref 101–111)
GFR calc non Af Amer: >60 mL/min (ref >60)
Glucose, Bld: 102 mg/dL — ABNORMAL HIGH (ref 65–99)
Potassium: 3.7 mmol/L (ref 3.5–5.1)
Sodium: 139 mmol/L (ref 135–145)
TOTAL PROTEIN: 7.1 g/dL (ref 6.5–8.1)

## 2017-11-04 LAB — APTT: aPTT: 26 seconds (ref 24–36)

## 2017-11-04 LAB — SURGICAL PCR SCREEN
MRSA, PCR: NEGATIVE
Staphylococcus aureus: NEGATIVE

## 2017-11-04 LAB — PROTIME-INR
INR: 1.12
Prothrombin Time: 14.3 seconds (ref 11.4–15.2)

## 2017-11-04 LAB — ABO/RH: ABO/RH(D): B POS

## 2017-11-10 NOTE — Anesthesia Preprocedure Evaluation (Signed)
Anesthesia Evaluation  Patient identified by MRN, date of birth, ID band Patient awake    Reviewed: Allergy & Precautions, NPO status , Patient's Chart, lab work & pertinent test results  History of Anesthesia Complications (+) PONV and history of anesthetic complications  Airway Mallampati: II  TM Distance: >3 FB Neck ROM: Full    Dental no notable dental hx.    Pulmonary neg pulmonary ROS,    Pulmonary exam normal breath sounds clear to auscultation       Cardiovascular hypertension, negative cardio ROS Normal cardiovascular exam Rhythm:Regular Rate:Normal     Neuro/Psych negative neurological ROS  negative psych ROS   GI/Hepatic negative GI ROS, Neg liver ROS,   Endo/Other  negative endocrine ROS  Renal/GU negative Renal ROS  negative genitourinary   Musculoskeletal negative musculoskeletal ROS (+) Arthritis , Osteoarthritis,    Abdominal   Peds negative pediatric ROS (+)  Hematology negative hematology ROS (+)   Anesthesia Other Findings   Reproductive/Obstetrics negative OB ROS                             Anesthesia Physical Anesthesia Plan  ASA: II  Anesthesia Plan: Combined Spinal and Epidural   Post-op Pain Management: GA combined w/ Regional for post-op pain   Induction:   PONV Risk Score and Plan: 2 and Treatment may vary due to age or medical condition, Ondansetron and Dexamethasone  Airway Management Planned: Nasal Cannula and Natural Airway  Additional Equipment:   Intra-op Plan:   Post-operative Plan:   Informed Consent: I have reviewed the patients History and Physical, chart, labs and discussed the procedure including the risks, benefits and alternatives for the proposed anesthesia with the patient or authorized representative who has indicated his/her understanding and acceptance.     Plan Discussed with:   Anesthesia Plan Comments: (  )         Anesthesia Quick Evaluation

## 2017-11-11 ENCOUNTER — Encounter (HOSPITAL_COMMUNITY)
Admission: RE | Disposition: A | Discharge status: Skilled Nursing Facility | Payer: Self-pay | Source: Ambulatory Visit | Attending: Orthopedic Surgery

## 2017-11-11 ENCOUNTER — Encounter (HOSPITAL_COMMUNITY): Payer: Self-pay | Admitting: Emergency Medicine

## 2017-11-11 ENCOUNTER — Inpatient Hospital Stay (HOSPITAL_COMMUNITY): Payer: PPO | Admitting: Anesthesiology

## 2017-11-11 ENCOUNTER — Other Ambulatory Visit: Payer: Self-pay

## 2017-11-11 ENCOUNTER — Inpatient Hospital Stay (HOSPITAL_COMMUNITY)
Admission: RE | Admit: 2017-11-11 | Discharge: 2017-11-16 | DRG: 462 | Disposition: A | Discharge status: Skilled Nursing Facility | Payer: PPO | Source: Ambulatory Visit | Attending: Orthopedic Surgery | Admitting: Orthopedic Surgery

## 2017-11-11 DIAGNOSIS — G8918 Other acute postprocedural pain: Secondary | ICD-10-CM | POA: Diagnosis not present

## 2017-11-11 DIAGNOSIS — M170 Bilateral primary osteoarthritis of knee: Secondary | ICD-10-CM | POA: Diagnosis not present

## 2017-11-11 DIAGNOSIS — M1711 Unilateral primary osteoarthritis, right knee: Secondary | ICD-10-CM | POA: Diagnosis not present

## 2017-11-11 DIAGNOSIS — Z96653 Presence of artificial knee joint, bilateral: Secondary | ICD-10-CM | POA: Diagnosis not present

## 2017-11-11 DIAGNOSIS — M25569 Pain in unspecified knee: Secondary | ICD-10-CM | POA: Diagnosis not present

## 2017-11-11 DIAGNOSIS — R2689 Other abnormalities of gait and mobility: Secondary | ICD-10-CM | POA: Diagnosis not present

## 2017-11-11 DIAGNOSIS — E7800 Pure hypercholesterolemia, unspecified: Secondary | ICD-10-CM | POA: Diagnosis not present

## 2017-11-11 DIAGNOSIS — Z79899 Other long term (current) drug therapy: Secondary | ICD-10-CM | POA: Diagnosis not present

## 2017-11-11 DIAGNOSIS — H9190 Unspecified hearing loss, unspecified ear: Secondary | ICD-10-CM | POA: Diagnosis present

## 2017-11-11 DIAGNOSIS — I10 Essential (primary) hypertension: Secondary | ICD-10-CM | POA: Diagnosis not present

## 2017-11-11 DIAGNOSIS — M1712 Unilateral primary osteoarthritis, left knee: Secondary | ICD-10-CM | POA: Diagnosis not present

## 2017-11-11 DIAGNOSIS — E782 Mixed hyperlipidemia: Secondary | ICD-10-CM | POA: Diagnosis not present

## 2017-11-11 DIAGNOSIS — M1710 Unilateral primary osteoarthritis, unspecified knee: Secondary | ICD-10-CM

## 2017-11-11 DIAGNOSIS — M545 Low back pain: Secondary | ICD-10-CM | POA: Diagnosis not present

## 2017-11-11 DIAGNOSIS — E785 Hyperlipidemia, unspecified: Secondary | ICD-10-CM | POA: Diagnosis not present

## 2017-11-11 DIAGNOSIS — H547 Unspecified visual loss: Secondary | ICD-10-CM | POA: Diagnosis present

## 2017-11-11 DIAGNOSIS — G8911 Acute pain due to trauma: Secondary | ICD-10-CM | POA: Diagnosis not present

## 2017-11-11 DIAGNOSIS — M179 Osteoarthritis of knee, unspecified: Secondary | ICD-10-CM | POA: Diagnosis present

## 2017-11-11 DIAGNOSIS — M6281 Muscle weakness (generalized): Secondary | ICD-10-CM | POA: Diagnosis not present

## 2017-11-11 HISTORY — PX: TOTAL KNEE ARTHROPLASTY: SHX125

## 2017-11-11 LAB — TYPE AND SCREEN
ABO/RH(D): B POS
Antibody Screen: NEGATIVE

## 2017-11-11 SURGERY — ARTHROPLASTY, KNEE, BILATERAL, TOTAL
Anesthesia: Spinal | Site: Knee | Laterality: Bilateral

## 2017-11-11 MED ORDER — DEXAMETHASONE SODIUM PHOSPHATE 10 MG/ML IJ SOLN
INTRAMUSCULAR | Status: AC
  Filled 2017-11-11: qty 1

## 2017-11-11 MED ORDER — PHENYLEPHRINE 40 MCG/ML (10ML) SYRINGE FOR IV PUSH (FOR BLOOD PRESSURE SUPPORT)
PREFILLED_SYRINGE | INTRAVENOUS | Status: DC | PRN
  Administered 2017-11-11 (×3): 80 ug via INTRAVENOUS

## 2017-11-11 MED ORDER — PROPOFOL 500 MG/50ML IV EMUL
INTRAVENOUS | Status: DC | PRN
  Administered 2017-11-11: 100 ug/kg/min via INTRAVENOUS

## 2017-11-11 MED ORDER — BISACODYL 10 MG RE SUPP: 10.0000 mg | Freq: Every day | RECTAL | Status: DC | PRN

## 2017-11-11 MED ORDER — FENTANYL CITRATE (PF) 100 MCG/2ML IJ SOLN: 25.0000 ug | INTRAMUSCULAR | Status: DC | PRN

## 2017-11-11 MED ORDER — OXYCODONE HCL 5 MG PO TABS: 5.0000 mg | ORAL_TABLET | Freq: Once | ORAL | Status: DC | PRN

## 2017-11-11 MED ORDER — DOCUSATE SODIUM 100 MG PO CAPS
100.0000 mg | ORAL_CAPSULE | Freq: Two times a day (BID) | ORAL | Status: DC
  Administered 2017-11-11 – 2017-11-16 (×10): 100 mg via ORAL
  Filled 2017-11-11 (×10): qty 1

## 2017-11-11 MED ORDER — BUPIVACAINE HCL (PF) 0.5 % IJ SOLN
INTRAMUSCULAR | Status: AC
  Filled 2017-11-11: qty 30

## 2017-11-11 MED ORDER — EPHEDRINE 5 MG/ML INJ
INTRAVENOUS | Status: AC
  Filled 2017-11-11: qty 10

## 2017-11-11 MED ORDER — WARFARIN SODIUM 4 MG PO TABS
4.0000 mg | ORAL_TABLET | Freq: Once | ORAL | Status: AC
  Administered 2017-11-12: 4 mg via ORAL
  Filled 2017-11-11: qty 1

## 2017-11-11 MED ORDER — KETOROLAC TROMETHAMINE 30 MG/ML IJ SOLN: 30.0000 mg | Freq: Once | INTRAMUSCULAR | Status: DC | PRN

## 2017-11-11 MED ORDER — METOCLOPRAMIDE HCL 5 MG PO TABS: 5.0000 mg | ORAL_TABLET | Freq: Three times a day (TID) | ORAL | Status: DC | PRN

## 2017-11-11 MED ORDER — ONDANSETRON HCL 4 MG/2ML IJ SOLN: 4.0000 mg | Freq: Once | INTRAMUSCULAR | Status: DC | PRN

## 2017-11-11 MED ORDER — METHOCARBAMOL 500 MG PO TABS
500.0000 mg | ORAL_TABLET | Freq: Four times a day (QID) | ORAL | Status: DC | PRN
  Administered 2017-11-12 – 2017-11-16 (×14): 500 mg via ORAL
  Filled 2017-11-11 (×15): qty 1

## 2017-11-11 MED ORDER — OXYCODONE HCL 5 MG/5ML PO SOLN
5.0000 mg | Freq: Once | ORAL | Status: DC | PRN
  Filled 2017-11-11: qty 5

## 2017-11-11 MED ORDER — LIDOCAINE 2% (20 MG/ML) 5 ML SYRINGE
INTRAMUSCULAR | Status: AC
Start: 2017-11-11 — End: ?
  Filled 2017-11-11: qty 5

## 2017-11-11 MED ORDER — LIDOCAINE 2% (20 MG/ML) 5 ML SYRINGE
INTRAMUSCULAR | Status: DC | PRN
  Administered 2017-11-11: 100 mg via INTRAVENOUS
  Administered 2017-11-11: 75 mg via INTRAVENOUS

## 2017-11-11 MED ORDER — METHOCARBAMOL 1000 MG/10ML IJ SOLN
500.0000 mg | Freq: Four times a day (QID) | INTRAVENOUS | Status: DC | PRN
  Filled 2017-11-11: qty 5

## 2017-11-11 MED ORDER — MORPHINE SULFATE (PF) 4 MG/ML IV SOLN
1.0000 mg | INTRAVENOUS | Status: DC | PRN
  Administered 2017-11-13: 1 mg via INTRAVENOUS
  Filled 2017-11-11: qty 1

## 2017-11-11 MED ORDER — DIPHENHYDRAMINE HCL 12.5 MG/5ML PO ELIX: 12.5000 mg | ORAL_SOLUTION | ORAL | Status: DC | PRN

## 2017-11-11 MED ORDER — ACETAMINOPHEN 500 MG PO TABS
1000.0000 mg | ORAL_TABLET | Freq: Four times a day (QID) | ORAL | Status: AC
  Administered 2017-11-11 – 2017-11-12 (×3): 1000 mg via ORAL
  Filled 2017-11-11 (×4): qty 2

## 2017-11-11 MED ORDER — ACETAMINOPHEN 160 MG/5ML PO SOLN: 325.0000 mg | ORAL | Status: DC | PRN

## 2017-11-11 MED ORDER — DEXAMETHASONE SODIUM PHOSPHATE 10 MG/ML IJ SOLN
10.0000 mg | Freq: Once | INTRAMUSCULAR | Status: AC
  Administered 2017-11-12: 10 mg via INTRAVENOUS
  Filled 2017-11-11: qty 1

## 2017-11-11 MED ORDER — GABAPENTIN 300 MG PO CAPS
ORAL_CAPSULE | ORAL | Status: AC
  Administered 2017-11-11: 300 mg via ORAL
  Filled 2017-11-11: qty 1

## 2017-11-11 MED ORDER — ROPIVACAINE HCL 2 MG/ML IJ SOLN
10.0000 mL/h | INTRAMUSCULAR | Status: DC
  Administered 2017-11-11: 8 mL/h via EPIDURAL
  Administered 2017-11-12 – 2017-11-13 (×2): 10 mL/h via EPIDURAL
  Filled 2017-11-11 (×6): qty 200

## 2017-11-11 MED ORDER — WARFARIN - PHARMACIST DOSING INPATIENT: Freq: Every day | Status: DC

## 2017-11-11 MED ORDER — METOCLOPRAMIDE HCL 5 MG/ML IJ SOLN: 5.0000 mg | Freq: Three times a day (TID) | INTRAMUSCULAR | Status: DC | PRN

## 2017-11-11 MED ORDER — BUPIVACAINE HCL (PF) 0.5 % IJ SOLN
INTRAMUSCULAR | Status: DC | PRN
  Administered 2017-11-11: 3 mL

## 2017-11-11 MED ORDER — ACETAMINOPHEN 325 MG PO TABS: 325.0000 mg | ORAL_TABLET | ORAL | Status: DC | PRN

## 2017-11-11 MED ORDER — EPHEDRINE SULFATE-NACL 50-0.9 MG/10ML-% IV SOSY
PREFILLED_SYRINGE | INTRAVENOUS | Status: DC | PRN
  Administered 2017-11-11 (×2): 10 mg via INTRAVENOUS

## 2017-11-11 MED ORDER — FLEET ENEMA 7-19 GM/118ML RE ENEM: 1.0000 | ENEMA | Freq: Once | RECTAL | Status: DC | PRN

## 2017-11-11 MED ORDER — DEXAMETHASONE SODIUM PHOSPHATE 10 MG/ML IJ SOLN
10.0000 mg | Freq: Once | INTRAMUSCULAR | Status: AC
  Administered 2017-11-11: 10 mg via INTRAVENOUS

## 2017-11-11 MED ORDER — LACTATED RINGERS IV SOLN
INTRAVENOUS | Status: DC
  Administered 2017-11-11 (×3): via INTRAVENOUS

## 2017-11-11 MED ORDER — TRANEXAMIC ACID 1000 MG/10ML IV SOLN
2000.0000 mg | Freq: Once | INTRAVENOUS | Status: AC
  Administered 2017-11-11: 2000 mg via TOPICAL
  Filled 2017-11-11: qty 20

## 2017-11-11 MED ORDER — CEFAZOLIN SODIUM-DEXTROSE 2-4 GM/100ML-% IV SOLN
INTRAVENOUS | Status: AC
  Filled 2017-11-11: qty 100

## 2017-11-11 MED ORDER — MEPERIDINE HCL 50 MG/ML IJ SOLN: 6.2500 mg | INTRAMUSCULAR | Status: DC | PRN

## 2017-11-11 MED ORDER — ONDANSETRON HCL 4 MG/2ML IJ SOLN
INTRAMUSCULAR | Status: AC
  Filled 2017-11-11: qty 2

## 2017-11-11 MED ORDER — HYDROCHLOROTHIAZIDE 25 MG PO TABS
25.0000 mg | ORAL_TABLET | Freq: Every day | ORAL | Status: DC
  Administered 2017-11-13 – 2017-11-16 (×3): 25 mg via ORAL
  Filled 2017-11-11 (×4): qty 1

## 2017-11-11 MED ORDER — CEFAZOLIN SODIUM-DEXTROSE 2-4 GM/100ML-% IV SOLN
2.0000 g | Freq: Four times a day (QID) | INTRAVENOUS | Status: AC
  Administered 2017-11-11 (×2): 2 g via INTRAVENOUS
  Filled 2017-11-11 (×2): qty 100

## 2017-11-11 MED ORDER — ACETAMINOPHEN 10 MG/ML IV SOLN
INTRAVENOUS | Status: AC
  Filled 2017-11-11: qty 100

## 2017-11-11 MED ORDER — LOSARTAN POTASSIUM 50 MG PO TABS
100.0000 mg | ORAL_TABLET | Freq: Every day | ORAL | Status: DC
  Administered 2017-11-13 – 2017-11-16 (×3): 100 mg via ORAL
  Filled 2017-11-11 (×4): qty 2

## 2017-11-11 MED ORDER — ONDANSETRON HCL 4 MG/2ML IJ SOLN
INTRAMUSCULAR | Status: DC | PRN
  Administered 2017-11-11: 4 mg via INTRAVENOUS

## 2017-11-11 MED ORDER — SODIUM CHLORIDE 0.9 % IR SOLN
Status: DC | PRN
  Administered 2017-11-11: 3000 mL

## 2017-11-11 MED ORDER — ACETAMINOPHEN 10 MG/ML IV SOLN
1000.0000 mg | Freq: Once | INTRAVENOUS | Status: AC
  Administered 2017-11-11: 1000 mg via INTRAVENOUS

## 2017-11-11 MED ORDER — GABAPENTIN 300 MG PO CAPS
300.0000 mg | ORAL_CAPSULE | Freq: Once | ORAL | Status: AC
  Administered 2017-11-11: 300 mg via ORAL

## 2017-11-11 MED ORDER — SODIUM CHLORIDE 0.9 % IV SOLN
INTRAVENOUS | Status: DC
  Administered 2017-11-11 – 2017-11-13 (×2): via INTRAVENOUS

## 2017-11-11 MED ORDER — LOSARTAN POTASSIUM-HCTZ 100-25 MG PO TABS: 1.0000 | ORAL_TABLET | Freq: Every day | ORAL | Status: DC

## 2017-11-11 MED ORDER — MENTHOL 3 MG MT LOZG: 1.0000 | LOZENGE | OROMUCOSAL | Status: DC | PRN

## 2017-11-11 MED ORDER — OXYCODONE HCL 5 MG PO TABS
5.0000 mg | ORAL_TABLET | ORAL | Status: DC | PRN
  Administered 2017-11-12 – 2017-11-13 (×3): 5 mg via ORAL
  Filled 2017-11-11 (×2): qty 1

## 2017-11-11 MED ORDER — ONDANSETRON HCL 4 MG/2ML IJ SOLN
4.0000 mg | Freq: Four times a day (QID) | INTRAMUSCULAR | Status: DC | PRN
  Administered 2017-11-12 – 2017-11-13 (×2): 4 mg via INTRAVENOUS
  Filled 2017-11-11 (×2): qty 2

## 2017-11-11 MED ORDER — ACETAMINOPHEN 325 MG PO TABS: 650.0000 mg | ORAL_TABLET | ORAL | Status: DC | PRN

## 2017-11-11 MED ORDER — OXYCODONE HCL 5 MG/5ML PO SOLN: 5.0000 mg | Freq: Once | ORAL | Status: DC | PRN

## 2017-11-11 MED ORDER — FENTANYL CITRATE (PF) 100 MCG/2ML IJ SOLN
INTRAMUSCULAR | Status: AC
  Filled 2017-11-11: qty 2

## 2017-11-11 MED ORDER — TRAMADOL HCL 50 MG PO TABS
50.0000 mg | ORAL_TABLET | Freq: Four times a day (QID) | ORAL | Status: DC | PRN
  Administered 2017-11-12: 100 mg via ORAL
  Filled 2017-11-11: qty 2

## 2017-11-11 MED ORDER — PROPOFOL 10 MG/ML IV BOLUS
INTRAVENOUS | Status: AC
  Filled 2017-11-11: qty 20

## 2017-11-11 MED ORDER — FENTANYL CITRATE (PF) 100 MCG/2ML IJ SOLN
INTRAMUSCULAR | Status: DC | PRN
  Administered 2017-11-11: 100 ug via INTRAVENOUS

## 2017-11-11 MED ORDER — ACETAMINOPHEN 650 MG RE SUPP: 650.0000 mg | RECTAL | Status: DC | PRN

## 2017-11-11 MED ORDER — MIDAZOLAM HCL 5 MG/5ML IJ SOLN
INTRAMUSCULAR | Status: DC | PRN
  Administered 2017-11-11: 2 mg via INTRAVENOUS

## 2017-11-11 MED ORDER — CEFAZOLIN SODIUM-DEXTROSE 2-4 GM/100ML-% IV SOLN
2.0000 g | INTRAVENOUS | Status: AC
  Administered 2017-11-11: 2 g via INTRAVENOUS

## 2017-11-11 MED ORDER — PROPOFOL 10 MG/ML IV BOLUS
INTRAVENOUS | Status: AC
  Filled 2017-11-11: qty 40

## 2017-11-11 MED ORDER — ONDANSETRON HCL 4 MG PO TABS
4.0000 mg | ORAL_TABLET | Freq: Four times a day (QID) | ORAL | Status: DC | PRN
  Administered 2017-11-15: 4 mg via ORAL
  Filled 2017-11-11: qty 1

## 2017-11-11 MED ORDER — MIDAZOLAM HCL 2 MG/2ML IJ SOLN
INTRAMUSCULAR | Status: AC
  Filled 2017-11-11: qty 2

## 2017-11-11 MED ORDER — OXYCODONE HCL 5 MG PO TABS
10.0000 mg | ORAL_TABLET | ORAL | Status: DC | PRN
  Administered 2017-11-12 – 2017-11-13 (×6): 10 mg via ORAL
  Filled 2017-11-11 (×7): qty 2

## 2017-11-11 MED ORDER — POLYETHYLENE GLYCOL 3350 17 G PO PACK: 17.0000 g | PACK | Freq: Every day | ORAL | Status: DC | PRN

## 2017-11-11 MED ORDER — CHLORHEXIDINE GLUCONATE 4 % EX LIQD: 60.0000 mL | Freq: Once | CUTANEOUS | Status: DC

## 2017-11-11 MED ORDER — PHENOL 1.4 % MT LIQD
1.0000 | OROMUCOSAL | Status: DC | PRN
  Filled 2017-11-11: qty 177

## 2017-11-11 SURGICAL SUPPLY — 57 items
BAG SPEC THK2 15X12 ZIP CLS (MISCELLANEOUS)
BAG ZIPLOCK 12X15 (MISCELLANEOUS) ×2 IMPLANT
BANDAGE ACE 6X5 VEL STRL LF (GAUZE/BANDAGES/DRESSINGS) ×4 IMPLANT
BANDAGE ESMARK 6X9 LF (GAUZE/BANDAGES/DRESSINGS) ×1 IMPLANT
BLADE SAG 18X100X1.27 (BLADE) ×3 IMPLANT
BLADE SAW SGTL 11.0X1.19X90.0M (BLADE) ×3 IMPLANT
BLADE SURG SZ10 CARB STEEL (BLADE) ×4 IMPLANT
BNDG CMPR 9X6 STRL LF SNTH (GAUZE/BANDAGES/DRESSINGS) ×1
BNDG COHESIVE 6X5 TAN STRL LF (GAUZE/BANDAGES/DRESSINGS) ×2 IMPLANT
BNDG ESMARK 6X9 LF (GAUZE/BANDAGES/DRESSINGS) ×2
BOWL SMART MIX CTS (DISPOSABLE) ×4 IMPLANT
CAPT KNEE TOTAL 3 ATTUNE ×2 IMPLANT
CEMENT HV SMART SET (Cement) ×8 IMPLANT
COVER SURGICAL LIGHT HANDLE (MISCELLANEOUS) ×2 IMPLANT
CUFF TOURN SGL QUICK 34 (TOURNIQUET CUFF) ×4
CUFF TRNQT CYL 34X4X40X1 (TOURNIQUET CUFF) ×2 IMPLANT
DRAPE EXTREMITY BILATERAL (DRAPES) ×2 IMPLANT
DRAPE INCISE IOBAN 66X45 STRL (DRAPES) ×2 IMPLANT
DRAPE POUCH INSTRU U-SHP 10X18 (DRAPES) ×2 IMPLANT
DRAPE U-SHAPE 47X51 STRL (DRAPES) ×6 IMPLANT
DRSG ADAPTIC 3X8 NADH LF (GAUZE/BANDAGES/DRESSINGS) ×4 IMPLANT
DRSG PAD ABDOMINAL 8X10 ST (GAUZE/BANDAGES/DRESSINGS) ×4 IMPLANT
DURAPREP 26ML APPLICATOR (WOUND CARE) ×4 IMPLANT
ELECT REM PT RETURN 15FT ADLT (MISCELLANEOUS) ×2 IMPLANT
EVACUATOR 1/8 PVC DRAIN (DRAIN) ×4 IMPLANT
FACESHIELD WRAPAROUND (MASK) ×4 IMPLANT
FACESHIELD WRAPAROUND OR TEAM (MASK) ×2 IMPLANT
GAUZE SPONGE 4X4 12PLY STRL (GAUZE/BANDAGES/DRESSINGS) ×4 IMPLANT
GLOVE BIO SURGEON STRL SZ7.5 (GLOVE) ×12 IMPLANT
GLOVE BIO SURGEON STRL SZ8 (GLOVE) ×4 IMPLANT
GLOVE BIOGEL PI IND STRL 8 (GLOVE) ×2 IMPLANT
GLOVE BIOGEL PI INDICATOR 8 (GLOVE) ×2
GOWN STRL REUS W/TWL LRG LVL3 (GOWN DISPOSABLE) ×2 IMPLANT
GOWN STRL REUS W/TWL XL LVL3 (GOWN DISPOSABLE) ×4 IMPLANT
HANDPIECE INTERPULSE COAX TIP (DISPOSABLE) ×2
IMMOBILIZER KNEE 20 (SOFTGOODS) ×4
IMMOBILIZER KNEE 20 THIGH 36 (SOFTGOODS) ×2 IMPLANT
MANIFOLD NEPTUNE II (INSTRUMENTS) ×2 IMPLANT
NS IRRIG 1000ML POUR BTL (IV SOLUTION) ×2 IMPLANT
PACK TOTAL KNEE CUSTOM (KITS) ×2 IMPLANT
PADDING CAST COTTON 6X4 STRL (CAST SUPPLIES) ×12 IMPLANT
SET HNDPC FAN SPRY TIP SCT (DISPOSABLE) ×1 IMPLANT
SPONGE LAP 18X18 X RAY DECT (DISPOSABLE) ×2 IMPLANT
STOCKINETTE 8 INCH (MISCELLANEOUS) ×2 IMPLANT
STRIP CLOSURE SKIN 1/2X4 (GAUZE/BANDAGES/DRESSINGS) ×8 IMPLANT
SUCTION FRAZIER HANDLE 12FR (TUBING) ×2
SUCTION TUBE FRAZIER 12FR DISP (TUBING) ×1 IMPLANT
SUT MNCRL AB 4-0 PS2 18 (SUTURE) ×4 IMPLANT
SUT STRATAFIX 0 PDS 27 VIOLET (SUTURE) ×2
SUT VIC AB 2-0 CT1 27 (SUTURE) ×14
SUT VIC AB 2-0 CT1 TAPERPNT 27 (SUTURE) ×6 IMPLANT
SUTURE STRATFX 0 PDS 27 VIOLET (SUTURE) ×1 IMPLANT
SYR 50ML LL SCALE MARK (SYRINGE) ×2 IMPLANT
TRAY FOLEY W/METER SILVER 16FR (SET/KITS/TRAYS/PACK) ×1 IMPLANT
WATER STERILE IRR 1000ML POUR (IV SOLUTION) ×4 IMPLANT
WRAP KNEE MAXI GEL POST OP (GAUZE/BANDAGES/DRESSINGS) ×4 IMPLANT
YANKAUER SUCT BULB TIP 10FT TU (MISCELLANEOUS) ×2 IMPLANT

## 2017-11-11 NOTE — Op Note (Signed)
Pre-operative diagnosis- Osteoarthritis  Bilateral knee(s)  Post-operative diagnosis- Osteoarthritis Bilateral knee(s)  Procedure-  Bilateral  Total Knee Arthroplasty  Surgeon- Dione Plover. Kenwood Rosiak, MD  Assistant- Arlee Muslim, PA-C   Anesthesia-  Spinal and Epidural  EBL-75 mL   Drains Hemovac x 1 each side  Tourniquet time- Left- 31 minutes @ 300 mm Hg   Right 39 minutes @ 284 mm HG    Complications- None  Condition-PACU - hemodynamically stable.   Brief Clinical Note  Alicia Mcbride is a 67 y.o. year old female with end stage OA of both knees with progressively worsening pain and dysfunction. The patient has constant pain, with activity and at rest and significant functional deficits with difficulties even with ADLs. The patient has had extensive non-op management including analgesics, injections of cortisone and viscosupplements, and home exercise program, but remains in significant pain with significant dysfunction. We discussed replacing both knees in the same setting versus one at a time including procedure, risks, potential complications, rehab course, and pros and cons associated with each and the patient elects to do both knees at the same time. The patient presents now for bilateral Total Knee Arthroplasty.     Procedure in detail---   The patient is brought into the operating room and positioned supine on the operating table. After successful administration of  Spinal and Epidural,   a tourniquet is placed high on the  Bilateral thigh(s) and the lower extremities are prepped and draped in the usual sterile fashion. Time out is performed by the operating team and then the  Left lower extremity is wrapped in Esmarch, knee flexed and the tourniquet inflated to 300 mmHg.       A midline incision is made with a ten blade through the subcutaneous tissue to the level of the extensor mechanism. A fresh blade is used to make a medial parapatellar arthrotomy. Soft tissue over the proximal  medial tibia is subperiosteally elevated to the joint line with a knife and into the semimembranosus bursa with a Cobb elevator. Soft tissue over the proximal lateral tibia is elevated with attention being paid to avoiding the patellar tendon on the tibial tubercle. The patella is everted, knee flexed 90 degrees and the ACL and PCL are removed. Findings are bone on bone medial and patellofemoral with large global osteophytes.        The drill is used to create a starting hole in the distal femur and the canal is thoroughly irrigated with sterile saline to remove the fatty contents. The 5 degree Left  valgus alignment guide is placed into the femoral canal and the distal femoral cutting block is pinned to remove 9 mm off the distal femur. Resection is made with an oscillating saw.      The tibia is subluxed forward and the menisci are removed. The extramedullary alignment guide is placed referencing proximally at the medial aspect of the tibial tubercle and distally along the second metatarsal axis and tibial crest. The block is pinned to remove 10mm off the more deficient medial  side. Resection is made with an oscillating saw. Size 5is the most appropriate size for the tibia and the proximal tibia is prepared with the modular drill and keel punch for that size.      The femoral sizing guide is placed and size 5 is most appropriate. Rotation is marked off the epicondylar axis and confirmed by creating a rectangular flexion gap at 90 degrees. The size 5 cutting block is pinned in  this rotation and the anterior, posterior and chamfer cuts are made with the oscillating saw. The intercondylar block is then placed and that cut is made.      Trial size 5 tibial component, trial size 5 posterior stabilized femur and a 8  mm posterior stabilized rotating platform insert trial is placed. Full extension is achieved with excellent varus/valgus and anterior/posterior balance throughout full range of motion. The patella is  everted and thickness measured to be 21  mm. Free hand resection is taken to 12 mm, a 35 template is placed, lug holes are drilled, trial patella is placed, and it tracks normally. Osteophytes are removed off the posterior femur with the trial in place. All trials are removed and the cut bone surfaces prepared with pulsatile lavage. Cement is mixed and once ready for implantation, the size 5 tibial implant, size  5 posterior stabilized femoral component, and the size 35 patella are cemented in place and the patella is held with the clamp. The trial insert is placed and the knee held in full extension.  All extruded cement is removed and once the cement is hard the permanent 8 mm posterior stabilized rotating platform insert is placed into the tibial tray.      The wound is copiously irrigated with saline solution and the extensor mechanism closed over a hemovac drain with #1 V-loc suture. The tourniquet is released for a total tourniquet time of 31  minutes. Flexion against gravity is 140 degrees and the patella tracks normally. Subcutaneous tissue is closed with 2.0 vicryl and subcuticular with running 4.0 Monocryl.       The  Right lower extremity is wrapped in Esmarch, knee flexed and the tourniquet inflated to 300 mmHg.       A midline incision is made with a ten blade through the subcutaneous tissue to the level of the extensor mechanism. A fresh blade is used to make a medial parapatellar arthrotomy. Soft tissue over the proximal medial tibia is subperiosteally elevated to the joint line with a knife and into the semimembranosus bursa with a Cobb elevator. Soft tissue over the proximal lateral tibia is elevated with attention being paid to avoiding the patellar tendon on the tibial tubercle. The patella is everted, knee flexed 90 degrees and the ACL and PCL are removed. Findings are bone on bone medial and patellofemoral with large global osteophytes        The drill is used to create a starting hole in  the distal femur and the canal is thoroughly irrigated with sterile saline to remove the fatty contents. The 5 degree Right  valgus alignment guide is placed into the femoral canal and the distal femoral cutting block is pinned to remove 9 mm off the distal femur. Resection is made with an oscillating saw.      The tibia is subluxed forward and the menisci are removed. The extramedullary alignment guide is placed referencing proximally at the medial aspect of the tibial tubercle and distally along the second metatarsal axis and tibial crest. The block is pinned to remove 55mm off the more deficient medial  side. Resection is made with an oscillating saw. Size 5is the most appropriate size for the tibia and the proximal tibia is prepared with the modular drill and keel punch for that size.      The femoral sizing guide is placed and size 5 is most appropriate. Rotation is marked off the epicondylar axis and confirmed by creating a rectangular flexion gap at  90 degrees. The size 5 cutting block is pinned in this rotation and the anterior, posterior and chamfer cuts are made with the oscillating saw. The intercondylar block is then placed and that cut is made.      Trial size 5 tibial component, trial size 5 posterior stabilized femur and a 6  mm posterior stabilized rotating platform insert trial is placed. Full extension is achieved with excellent varus/valgus and anterior/posterior balance throughout full range of motion. The patella is everted and thickness measured to be 21  mm. Free hand resection is taken to 12 mm, a 35 template is placed, lug holes are drilled, trial patella is placed, and it tracks normally. Osteophytes are removed off the posterior femur with the trial in place. All trials are removed and the cut bone surfaces prepared with pulsatile lavage. Cement is mixed and once ready for implantation, the size 5 tibial implant, size  5 posterior stabilized femoral component, and the size 35 patella are  cemented in place and the patella is held with the clamp. The trial insert is placed and the knee held in full extension.   All extruded cement is removed and once the cement is hard the permanent 6 mm posterior stabilized rotating platform insert is placed into the tibial tray.      The wound is copiously irrigated with saline solution and the extensor mechanism closed over a hemovac drain with #1 V-loc suture. The tourniquet is released for a total tourniquet time of 39  minutes. Flexion against gravity is 140 degrees and the patella tracks normally. Subcutaneous tissue is closed with 2.0 vicryl and subcuticular with running 4.0 Monocryl. The incisions are cleaned and dried and steri-strips and  bulky sterile dressings are applied. The limbs are placed into knee immobilizers and the patient is awakened and transported to recovery in stable condition.            Please note that a surgical assistant was a medical necessity for this procedure in order to perform it in a safe and expeditious manner. Surgical assistant was necessary to retract the ligaments and vital neurovascular structures to prevent injury to them and also necessary for proper positioning of the limb to allow for anatomic placement of the prosthesis.   Dione Plover Jaimie Pippins, MD    11/11/2017, 11:50 AM

## 2017-11-11 NOTE — Anesthesia Procedure Notes (Signed)
Epidural Patient location during procedure: OR Start time: 11/11/2017 9:40 AM End time: 11/11/2017 9:50 AM  Staffing Anesthesiologist: Janeece Riggers, MD Performed: anesthesiologist   Preanesthetic Checklist Completed: patient identified, site marked, surgical consent, pre-op evaluation, timeout performed, IV checked, risks and benefits discussed and monitors and equipment checked  Epidural Patient position: sitting Prep: site prepped and draped and DuraPrep Patient monitoring: continuous pulse ox and blood pressure Approach: midline Location: L3-L4 Injection technique: LOR air  Needle:  Needle type: Tuohy  Needle gauge: 17 G Needle length: 9 cm and 9 Needle insertion depth: 7 cm Catheter type: closed end flexible Catheter size: 19 Gauge Catheter at skin depth: 13 cm Test dose: negative  Assessment Events: blood not aspirated, injection not painful, no injection resistance, negative IV test and no paresthesia  Additional Notes After LOR to air at circa 6cm a 24 Sprotte 161mm needle was passed through Epidural needle with +CSF / DOSE / +CSF .  Spinal needle removed and catheter threaded with negative aspiration.  To be tested in PACU.  Reason for block:at surgeon's request, post-op pain management, procedure for pain and surgical anesthesia

## 2017-11-11 NOTE — Progress Notes (Signed)
ANTICOAGULATION CONSULT NOTE - Initial Consult  Pharmacy Consult for Warfarin Indication: VTE prophylaxis  No Known Allergies  Patient Measurements: Height: 5' 7.5" (171.5 cm) Weight: 194 lb (88 kg) IBW/kg (Calculated) : 62.75  Vital Signs: Temp: 98.5 F (36.9 C) (01/02 1523) Temp Source: Axillary (01/02 1523) BP: 115/57 (01/02 1423) Pulse Rate: 61 (01/02 1523)  Labs: No results for input(s): HGB, HCT, PLT, APTT, LABPROT, INR, HEPARINUNFRC, HEPRLOWMOCWT, CREATININE, CKTOTAL, CKMB, TROPONINI in the last 72 hours.  Estimated Creatinine Clearance: 80.7 mL/min (by C-G formula based on SCr of 0.61 mg/dL).   Medical History: Past Medical History:  Diagnosis Date  . Allergy   . Gynecological examination    sees Dr. Alden Hipp  . Hypertension   . PONV (postoperative nausea and vomiting)     Medications:  Scheduled:  . acetaminophen  1,000 mg Oral Q6H  . [START ON 11/12/2017] dexamethasone  10 mg Intravenous Once  . docusate sodium  100 mg Oral BID  . [START ON 11/12/2017] losartan  100 mg Oral Daily   And  . [START ON 11/12/2017] hydrochlorothiazide  25 mg Oral Daily  . [START ON 11/12/2017] warfarin  4 mg Oral Once  . Warfarin - Pharmacist Dosing Inpatient   Does not apply q1800   Infusions:  . sodium chloride    .  ceFAZolin (ANCEF) IV    . methocarbamol (ROBAXIN)  IV    . ropivacaine (PF) 2 mg/mL (0.2%) 8 mL/hr (11/11/17 1236)   PRN: [START ON 11/12/2017] acetaminophen **OR** [START ON 11/12/2017] acetaminophen, bisacodyl, diphenhydrAMINE, menthol-cetylpyridinium **OR** phenol, methocarbamol **OR** methocarbamol (ROBAXIN)  IV, metoCLOPramide **OR** metoCLOPramide (REGLAN) injection, morphine injection, ondansetron **OR** ondansetron (ZOFRAN) IV, oxyCODONE, oxyCODONE, polyethylene glycol, sodium phosphate, traMADol  Assessment: 67 yo female s/p bilateral TKA on 1/2.  Pharmacy consulted to begin warfarin for post-op VTE prophylaxis.  Since patient has an indwelling epidural  catheter, MD has ordered for warfarin to start at [facility] on 1/3.  Goal of Therapy:  INR 2-3 Monitor platelets by anticoagulation protocol: Yes   Plan:   Warfarin 4mg  PO x 1 tomorrow at 9AM  Daily PT/INR  Provide warfarin education prior to discharge  Peggyann Juba, PharmD, BCPS Pager: [phone] 11/11/2017,3:51 PM

## 2017-11-11 NOTE — Interval H&P Note (Signed)
History and Physical Interval Note:  11/11/2017 8:25 AM  Alicia Mcbride  has presented today for surgery, with the diagnosis of Bilateral Knee osteoarthritis  The various methods of treatment have been discussed with the patient and family. After consideration of risks, benefits and other options for treatment, the patient has consented to  Procedure(s): TOTAL KNEE BILATERAL (Bilateral) as a surgical intervention .  The patient's history has been reviewed, patient examined, no change in status, stable for surgery.  I have reviewed the patient's chart and labs.  Questions were answered to the patient's satisfaction.     [name]

## 2017-11-11 NOTE — Transfer of Care (Signed)
Immediate Anesthesia Transfer of Care Note  Patient: Alicia Mcbride  Procedure(s) Performed: TOTAL KNEE BILATERAL (Bilateral Knee)  Patient Location: PACU  Anesthesia Type:Regional and Spinal  Level of Consciousness: awake, alert  and oriented  Airway & Oxygen Therapy: Patient Spontanous Breathing and Patient connected to face mask oxygen  Post-op Assessment: Report given to RN and Post -op Vital signs reviewed and stable  Post vital signs: Reviewed and stable  Last Vitals:  Vitals:   11/11/17 0805  BP: 132/80  Pulse: 75  Resp: 18  Temp: 36.8 C  SpO2: 100%    Last Pain:  Vitals:   11/11/17 0805  TempSrc: Oral      Patients Stated Pain Goal: 4 (75/05/18 3358)  Complications: No apparent anesthesia complications

## 2017-11-11 NOTE — Anesthesia Postprocedure Evaluation (Signed)
Anesthesia Post Note  Patient: Alicia Mcbride  Procedure(s) Performed: TOTAL KNEE BILATERAL (Bilateral Knee)     Patient location during evaluation: PACU Anesthesia Type: Spinal and Epidural Level of consciousness: oriented, awake and alert and awake Pain management: pain level controlled Vital Signs Assessment: post-procedure vital signs reviewed and stable Respiratory status: spontaneous breathing, respiratory function stable and patient connected to nasal cannula oxygen Cardiovascular status: blood pressure returned to baseline and stable Postop Assessment: no headache, no backache, no apparent nausea or vomiting and spinal receding Anesthetic complications: no    Last Vitals:  Vitals:   11/11/17 1330 11/11/17 1345  BP: 108/63 91/70  Pulse: 68 (!) 59  Resp: 14 13  Temp:    SpO2: 100% 100%    Last Pain:  Vitals:   11/11/17 1330  TempSrc:   PainSc: 0-No pain                 Elishua Radford

## 2017-11-12 LAB — BASIC METABOLIC PANEL
ANION GAP: 8 (ref 5–15)
BUN: 14 mg/dL (ref 6–20)
CHLORIDE: 104 mmol/L (ref 101–111)
CO2: 25 mmol/L (ref 22–32)
Calcium: 8.2 mg/dL — ABNORMAL LOW (ref 8.9–10.3)
Creatinine, Ser: 0.52 mg/dL (ref 0.44–1.00)
GFR calc Af Amer: >60 mL/min (ref >60)
Glucose, Bld: 142 mg/dL — ABNORMAL HIGH (ref 65–99)
POTASSIUM: 4.3 mmol/L (ref 3.5–5.1)
SODIUM: 137 mmol/L (ref 135–145)

## 2017-11-12 LAB — CBC
HCT: 32.5 % — ABNORMAL LOW (ref 36.0–46.0)
Hemoglobin: 10.9 g/dL — ABNORMAL LOW (ref 12.0–15.0)
MCH: 29.6 pg (ref 26.0–34.0)
MCHC: 33.5 g/dL (ref 30.0–36.0)
MCV: 88.3 fL (ref 78.0–100.0)
PLATELETS: 279 10*3/uL (ref 150–400)
RBC: 3.68 MIL/uL — AB (ref 3.87–5.11)
RDW: 13.2 % (ref 11.5–15.5)
WBC: 13.8 10*3/uL — AB (ref 4.0–10.5)

## 2017-11-12 LAB — PROTIME-INR
INR: 1.24
PROTHROMBIN TIME: 15.5 s — AB (ref 11.4–15.2)

## 2017-11-12 MED ORDER — SODIUM CHLORIDE 0.9 % IV BOLUS (SEPSIS)
250.0000 mL | Freq: Once | INTRAVENOUS | Status: AC
  Administered 2017-11-12: 10:00:00 via INTRAVENOUS

## 2017-11-12 MED ORDER — NALOXEGOL OXALATE 25 MG PO TABS
25.0000 mg | ORAL_TABLET | Freq: Every day | ORAL | Status: DC
  Administered 2017-11-12 – 2017-11-16 (×5): 25 mg via ORAL
  Filled 2017-11-12 (×6): qty 1

## 2017-11-12 MED ORDER — TRAMADOL HCL 50 MG PO TABS
50.0000 mg | ORAL_TABLET | Freq: Four times a day (QID) | ORAL | Status: DC | PRN
  Administered 2017-11-16: 100 mg via ORAL
  Filled 2017-11-12: qty 2

## 2017-11-12 NOTE — Progress Notes (Signed)
Rehab admissions - I am following for potential acute inpatient rehab admission.  Noted PT recommending CIR.  Awaiting OT evaluation.  Patient has [employer], so we will need authorization prior to inpatient rehab admission.  I will see patient in the am.  Call me for questions.  #[phone]

## 2017-11-12 NOTE — Plan of Care (Signed)
Plan of care reviewed and discussed with patient.   

## 2017-11-12 NOTE — Plan of Care (Signed)
Plan of care reviewed with pt, understanding was verbalized.

## 2017-11-12 NOTE — Progress Notes (Signed)
Physical Therapy Treatment Patient Details Name: Alicia Mcbride MRN: [id number] DOB: 1951/04/05 Today's Date: 11/12/2017    History of Present Illness Pt s/p bil TKR     PT Comments    Pt assisted back to bed.  Increased cueing for sequence and use of UEs required.   Follow Up Recommendations  CIR     Equipment Recommendations  None recommended by PT    Recommendations for Other Services OT consult     Precautions / Restrictions Precautions Precautions: Knee Required Braces or Orthoses: Knee Immobilizer - Right;Knee Immobilizer - Left Knee Immobilizer - Right: Discontinue once straight leg raise with < 10 degree lag Knee Immobilizer - Left: Discontinue once straight leg raise with < 10 degree lag Restrictions Weight Bearing Restrictions: No Other Position/Activity Restrictions: WBAT    Mobility  Bed Mobility Overal bed mobility: Needs Assistance Bed Mobility: Sit to Supine     Supine to sit: Min assist;Mod assist;+2 for physical assistance;+2 for safety/equipment Sit to supine: Min assist;Mod assist;+2 for physical assistance;+2 for safety/equipment   General bed mobility comments: cues for sequence and physical assist to manage bil LEs and to control trunk  Transfers Overall transfer level: Needs assistance Equipment used: Rolling walker (2 wheeled) Transfers: Sit to/from Stand Sit to Stand: Mod assist;+2 physical assistance;+2 safety/equipment;From elevated surface Stand pivot transfers: Mod assist;+2 physical assistance;+2 safety/equipment;From elevated surface       General transfer comment: Cues for LE management and use of UEs to self assist.  Physical assist to bring wt up and fwd, to balance in standing and to control decent  Ambulation/Gait Ambulation/Gait assistance: Mod assist;+2 physical assistance;+2 safety/equipment Ambulation Distance (Feet): 2 Feet Assistive device: Rolling walker (2 wheeled) Gait Pattern/deviations: Step-to pattern;Decreased  step length - right;Decreased step length - left;Shuffle;Trunk flexed Gait velocity: decr Gait velocity interpretation: Below normal speed for age/gender General Gait Details: cues for sequence, posture and position from RW.  Physical assist for balance and support   Stairs            Wheelchair Mobility    Modified Rankin (Stroke Patients Only)       Balance Overall balance assessment: Needs assistance Sitting-balance support: Bilateral upper extremity supported;Feet supported Sitting balance-Leahy Scale: Fair     Standing balance support: Bilateral upper extremity supported Standing balance-Leahy Scale: Poor                              Cognition Arousal/Alertness: Awake/alert Behavior During Therapy: WFL for tasks assessed/performed Overall Cognitive Status: Within Functional Limits for tasks assessed                                        Exercises Total Joint Exercises Ankle Circles/Pumps: AROM;Both;20 reps;Supine Quad Sets: AROM;Both;10 reps;Supine Heel Slides: AAROM;Both;10 reps;Supine Straight Leg Raises: AAROM;Right;10 reps;Supine    General Comments        Pertinent Vitals/Pain Pain Assessment: 0-10 Pain Score: 4  Pain Location: L knee - min pain R knee with epidural in place Pain Descriptors / Indicators: Sore;Aching Pain Intervention(s): Limited activity within patient's tolerance;Monitored during session;Premedicated before session;Ice applied    Home Living Family/patient expects to be discharged to:: Inpatient rehab                    Prior Function Level of Independence: Independent;Independent with assistive device(s)  PT Goals (current goals can now be found in the care plan section) Acute Rehab PT Goals Patient Stated Goal: rehab and then home  PT Goal Formulation: With patient Time For Goal Achievement: 11/19/17 Potential to Achieve Goals: Good    Frequency    7X/week      PT  Plan Current plan remains appropriate    Co-evaluation              AM-PAC PT "6 Clicks" Daily Activity  Outcome Measure  Difficulty turning over in bed (including adjusting bedclothes, sheets and blankets)?: Unable Difficulty moving from lying on back to sitting on the side of the bed? : Unable Difficulty sitting down on and standing up from a chair with arms (e.g., wheelchair, bedside commode, etc,.)?: Unable Help needed moving to and from a bed to chair (including a wheelchair)?: A Lot Help needed walking in hospital room?: A Lot Help needed climbing 3-5 steps with a railing? : Total 6 Click Score: 8    End of Session Equipment Utilized During Treatment: Gait belt;Right knee immobilizer;Left knee immobilizer Activity Tolerance: Patient tolerated treatment well Patient left: with call bell/phone within reach;in bed Nurse Communication: Mobility status PT Visit Diagnosis: Difficulty in walking, not elsewhere classified (R26.2)     Time: 2633-3545 PT Time Calculation (min) (ACUTE ONLY): 22 min  Charges:  $Therapeutic Exercise: 8-22 mins $Therapeutic Activity: 8-22 mins                    G Codes:       Pg [phone]    Alicia Mcbride 11/12/2017, 2:32 PM

## 2017-11-12 NOTE — Progress Notes (Signed)
Anesthesiology Follow-up:  67 year old female one day S/P bilateral total knee arthroplasties.  Awake and alert, in good spirits, complaining of L. knee pain, no pain on right. Epidural Ropivacaine 0.2% at 8 cc/hr.  VS: T- 36.9 BP- 126/68 HR- 87 RR- 16 O2 Sat 96% on RA.  Epidural catheter withdrawn 1 cm, dressing re-taped. Will continue epidural infusion at 8 cc/hr   Alicia Mcbride

## 2017-11-12 NOTE — Addendum Note (Signed)
Addendum  created 11/12/17 1232 by Roberts Gaudy, MD   Sign clinical note

## 2017-11-12 NOTE — Progress Notes (Signed)
CSW consult-SNF Placement.   CSW following for discharge needs if SNF needs arise. CIR has been recommended at this time.   Kathrin Greathouse, Latanya Presser, MSW Clinical Social Worker  [phone] 11/12/2017  1:14 PM

## 2017-11-12 NOTE — Evaluation (Signed)
Physical Therapy Evaluation Patient Details Name: Alicia Mcbride MRN: [id number] DOB: 20-Mar-1951 Today's Date: 11/12/2017   History of Present Illness  Pt s/p bil TKR   Clinical Impression  Pt s/p bil TKR and presents with functional mobility limitations 2* Bil LE weakness and post op pain.  Pt would benefit from follow up rehab at CIR level to maximize IND and safety for return home.    Follow Up Recommendations CIR    Equipment Recommendations  None recommended by PT    Recommendations for Other Services OT consult     Precautions / Restrictions Precautions Precautions: Knee Required Braces or Orthoses: Knee Immobilizer - Right;Knee Immobilizer - Left Knee Immobilizer - Right: Discontinue once straight leg raise with < 10 degree lag Knee Immobilizer - Left: Discontinue once straight leg raise with < 10 degree lag Restrictions Weight Bearing Restrictions: No Other Position/Activity Restrictions: WBAT      Mobility  Bed Mobility Overal bed mobility: Needs Assistance Bed Mobility: Supine to Sit     Supine to sit: Min assist;Mod assist;+2 for physical assistance;+2 for safety/equipment     General bed mobility comments: cues for sequence and physical assist to manage bil LEs and to bring trunk to upright  Transfers Overall transfer level: Needs assistance Equipment used: Rolling walker (2 wheeled) Transfers: Sit to/from Stand Sit to Stand: Mod assist;+2 physical assistance;+2 safety/equipment;From elevated surface Stand pivot transfers: Min assist;Mod assist;+2 physical assistance;+2 safety/equipment;From elevated surface       General transfer comment: Cues for LE management and use of UEs to self assist.  Physical assist to bring wt up and fwd, to balance in standing and to control decent  Ambulation/Gait Ambulation/Gait assistance: Mod assist;+2 physical assistance;+2 safety/equipment Ambulation Distance (Feet): 2 Feet Assistive device: Rolling walker (2  wheeled) Gait Pattern/deviations: Step-to pattern;Decreased step length - right;Decreased step length - left;Shuffle;Trunk flexed Gait velocity: decr   General Gait Details: cues for sequence, posture and position from RW.  Physical assist for balance and support  Stairs            Wheelchair Mobility    Modified Rankin (Stroke Patients Only)       Balance Overall balance assessment: Needs assistance Sitting-balance support: Bilateral upper extremity supported;Feet supported Sitting balance-Leahy Scale: Fair     Standing balance support: Bilateral upper extremity supported Standing balance-Leahy Scale: Poor                               Pertinent Vitals/Pain Pain Assessment: 0-10 Pain Score: 4  Pain Location: L knee - min pain R knee with epidural in place Pain Descriptors / Indicators: Sore;Aching Pain Intervention(s): Limited activity within patient's tolerance;Monitored during session;Premedicated before session;Ice applied    Home Living Family/patient expects to be discharged to:: Inpatient rehab                      Prior Function Level of Independence: Independent;Independent with assistive device(s)               Hand Dominance        Extremity/Trunk Assessment   Upper Extremity Assessment Upper Extremity Assessment: Overall WFL for tasks assessed    Lower Extremity Assessment Lower Extremity Assessment: RLE deficits/detail;LLE deficits/detail RLE Deficits / Details: 2/5 quads with AAROM at knee -10 - 90 LLE Deficits / Details: 2+/5 quads with AAROM at knee -10 - 40    Cervical / Trunk Assessment Cervical /  Trunk Assessment: Normal  Communication   Communication: HOH  Cognition Arousal/Alertness: Awake/alert Behavior During Therapy: WFL for tasks assessed/performed Overall Cognitive Status: Within Functional Limits for tasks assessed                                        General Comments       Exercises Total Joint Exercises Ankle Circles/Pumps: AROM;Both;20 reps;Supine Quad Sets: AROM;Both;10 reps;Supine Heel Slides: AAROM;Both;10 reps;Supine Straight Leg Raises: AAROM;Right;10 reps;Supine   Assessment/Plan    PT Assessment Patient needs continued PT services  PT Problem List Decreased strength;Decreased range of motion;Decreased activity tolerance;Decreased balance;Decreased mobility;Decreased knowledge of use of DME;Pain       PT Treatment Interventions DME instruction;Gait training;Stair training;Functional mobility training;Therapeutic activities;Therapeutic exercise;Patient/family education    PT Goals (Current goals can be found in the Care Plan section)  Acute Rehab PT Goals Patient Stated Goal: rehab and then home  PT Goal Formulation: With patient Time For Goal Achievement: 11/19/17 Potential to Achieve Goals: Good    Frequency 7X/week   Barriers to discharge        Co-evaluation               AM-PAC PT "6 Clicks" Daily Activity  Outcome Measure Difficulty turning over in bed (including adjusting bedclothes, sheets and blankets)?: Unable Difficulty moving from lying on back to sitting on the side of the bed? : Unable Difficulty sitting down on and standing up from a chair with arms (e.g., wheelchair, bedside commode, etc,.)?: Unable Help needed moving to and from a bed to chair (including a wheelchair)?: A Lot Help needed walking in hospital room?: A Lot Help needed climbing 3-5 steps with a railing? : Total 6 Click Score: 8    End of Session Equipment Utilized During Treatment: Gait belt;Right knee immobilizer;Left knee immobilizer Activity Tolerance: Patient tolerated treatment well Patient left: in chair;with call bell/phone within reach;with chair alarm set Nurse Communication: Mobility status PT Visit Diagnosis: Difficulty in walking, not elsewhere classified (R26.2)    Time: 5364-6803 PT Time Calculation (min) (ACUTE ONLY): 40  min   Charges:   PT Evaluation $PT Eval Low Complexity: 1 Low PT Treatments $Therapeutic Exercise: 8-22 mins $Therapeutic Activity: 8-22 mins   PT G Codes:        Pg [phone]   Elnor Renovato 11/12/2017, 1:01 PM

## 2017-11-12 NOTE — Progress Notes (Signed)
   Subjective: 1 Day Post-Op Procedure(s) (LRB): TOTAL KNEE BILATERAL (Bilateral) Patient reports pain as mild and moderate.   Patient seen in rounds by Dr. Wynelle Link. Patient is well, but has had some minor complaints of pain in the knees, requiring pain medications We will start therapy today.  Will consult CIR. Plan is to go [facility] after hospital stay.  Objective: Vital signs in last 24 hours: Temp:  [97.5 F (36.4 C)-98.7 F (37.1 C)] 97.5 F (36.4 C) (01/03 0525) Pulse Rate:  [54-81] 81 (01/03 0525) Resp:  [7-18] 18 (01/03 0825) BP: (91-130)/(48-82) 107/69 (01/03 0525) SpO2:  [97 %-100 %] 98 % (01/03 0825)  Intake/Output from previous day:  Intake/Output Summary (Last 24 hours) at 11/12/2017 0827 Last data filed at 11/12/2017 0600 Gross per 24 hour  Intake 5004.45 ml  Output 2465 ml  Net 2539.45 ml    Intake/Output this shift: No intake/output data recorded.  Labs: Recent Labs    11/12/17 0542  HGB 10.9*   Recent Labs    11/12/17 0542  WBC 13.8*  RBC 3.68*  HCT 32.5*  PLT 279   Recent Labs    11/12/17 0542  NA 137  K 4.3  CL 104  CO2 25  BUN 14  CREATININE 0.52  GLUCOSE 142*  CALCIUM 8.2*   Recent Labs    11/12/17 0542  INR 1.24    EXAM General - Patient is Alert, Appropriate and Oriented Extremity - Neurovascular intact Sensation intact distally Dressing - dressing C/D/I Motor Function - intact, moving feet and toes well on exam.  Both Hemovacs pulled without difficulty.  Past Medical History:  Diagnosis Date  . Allergy   . Gynecological examination    sees Dr. Alden Hipp  . Hypertension   . PONV (postoperative nausea and vomiting)     Assessment/Plan: 1 Day Post-Op Procedure(s) (LRB): TOTAL KNEE BILATERAL (Bilateral) Principal Problem:   OA (osteoarthritis) of knee  Estimated body mass index is 29.94 kg/m as calculated from the following:   Height as of this encounter: 5' 7.5" (1.715 m).   Weight as of this  encounter: 88 kg (194 lb). Up with therapy Continue foley for now.  Will keep foley until tomorrow and will not be removed until at least 6-8 hours following the removal of the epidural catheter.  DVT Prophylaxis - Lovenox and Coumadin, Lovenox will not start until tomorrow afternoon following removal of the epidural. First dose of Coumadin this evening. Weight-Bearing as tolerated to both leg  Continue O2 and Pulse OX   Take Coumadin for four weeks and then discontinue.  The dose may need to be adjusted based upon the INR.  Please follow the INR and titrate Coumadin dose for a therapeutic range between 2.0 and 3.0 INR.  After completing the four weeks of Coumadin, the patient may stop the Coumadin and then 81 mg Aspirin daily for three more weeks.  Lovenox injections will start tomorrow evening after the epidural has been removed and continue until the INR is therapeutic at or greater than 2.0.  When INR reaches the therapeutic level of equal to or greater than 2.0, the patient may discontinue the Lovenox injections.  Arlee Muslim, PA-C Orthopaedic Surgery 11/12/2017, 8:27 AM

## 2017-11-12 NOTE — Progress Notes (Signed)
Plan for d/c to CIR vs SNF, will continue to follow pending decision. [phone]

## 2017-11-12 NOTE — Progress Notes (Signed)
ANTICOAGULATION CONSULT NOTE  Pharmacy Consult for Warfarin Indication: VTE prophylaxis  No Known Allergies  Patient Measurements: Height: 5' 7.5" (171.5 cm) Weight: 194 lb (88 kg) IBW/kg (Calculated) : 62.75  Vital Signs: Temp: 98.4 F (36.9 C) (01/03 0945) Temp Source: Oral (01/03 0525) BP: 126/68 (01/03 0945) Pulse Rate: 87 (01/03 0945)  Labs: Recent Labs    11/12/17 0542  HGB 10.9*  HCT 32.5*  PLT 279  LABPROT 15.5*  INR 1.24  CREATININE 0.52    Estimated Creatinine Clearance: 80.7 mL/min (by C-G formula based on SCr of 0.52 mg/dL).   Medical History: Past Medical History:  Diagnosis Date  . Allergy   . Gynecological examination    sees Dr. Alden Hipp  . Hypertension   . PONV (postoperative nausea and vomiting)     Medications:  Scheduled:  . acetaminophen  1,000 mg Oral Q6H  . docusate sodium  100 mg Oral BID  . losartan  100 mg Oral Daily   And  . hydrochlorothiazide  25 mg Oral Daily  . naloxegol oxalate  25 mg Oral Daily  . Warfarin - Pharmacist Dosing Inpatient   Does not apply q1800   Infusions:  . sodium chloride 75 mL/hr at 11/11/17 1623  . methocarbamol (ROBAXIN)  IV    . ropivacaine (PF) 2 mg/mL (0.2%) 10 mL/hr (11/12/17 0826)   PRN: acetaminophen **OR** acetaminophen, bisacodyl, diphenhydrAMINE, menthol-cetylpyridinium **OR** phenol, methocarbamol **OR** methocarbamol (ROBAXIN)  IV, metoCLOPramide **OR** metoCLOPramide (REGLAN) injection, morphine injection, ondansetron **OR** ondansetron (ZOFRAN) IV, oxyCODONE, oxyCODONE, polyethylene glycol, sodium phosphate, traMADol  Assessment: 67 yo female s/p bilateral TKA on 1/2.  Pharmacy consulted to begin warfarin for post-op VTE prophylaxis.  Since patient has an indwelling epidural catheter, MD has ordered for warfarin to start at [facility] on 1/3.  Today, 11/12/2017  INR = 1.24 prior to warfarin dose this am  Hgb down - suspect ABLA, pltc WNL  Epidural remains  Goal of Therapy:  INR  2-3 Monitor platelets by anticoagulation protocol: Yes   Plan:   Warfarin 4mg  PO given at 9AM today  No further warfarin at this time until epidural out 1/4  F/u start of enoxaparin following epidural removal  Daily PT/INR  Provide warfarin education prior to discharge  Doreene Eland, PharmD, BCPS.   Pager: [phone] 11/12/2017 1:33 PM

## 2017-11-13 LAB — BASIC METABOLIC PANEL
Anion gap: 6 (ref 5–15)
BUN: 12 mg/dL (ref 6–20)
CO2: 27 mmol/L (ref 22–32)
Calcium: 8.2 mg/dL — ABNORMAL LOW (ref 8.9–10.3)
Chloride: 104 mmol/L (ref 101–111)
Creatinine, Ser: 0.58 mg/dL (ref 0.44–1.00)
GFR calc Af Amer: >60 mL/min (ref >60)
GLUCOSE: 114 mg/dL — AB (ref 65–99)
POTASSIUM: 3.6 mmol/L (ref 3.5–5.1)
Sodium: 137 mmol/L (ref 135–145)

## 2017-11-13 LAB — CBC
HEMATOCRIT: 29.5 % — AB (ref 36.0–46.0)
Hemoglobin: 10 g/dL — ABNORMAL LOW (ref 12.0–15.0)
MCH: 30.1 pg (ref 26.0–34.0)
MCHC: 33.9 g/dL (ref 30.0–36.0)
MCV: 88.9 fL (ref 78.0–100.0)
PLATELETS: 246 10*3/uL (ref 150–400)
RBC: 3.32 MIL/uL — AB (ref 3.87–5.11)
RDW: 13.3 % (ref 11.5–15.5)
WBC: 14.6 10*3/uL — ABNORMAL HIGH (ref 4.0–10.5)

## 2017-11-13 LAB — PROTIME-INR
INR: 1.52
Prothrombin Time: 18.2 seconds — ABNORMAL HIGH (ref 11.4–15.2)

## 2017-11-13 MED ORDER — ENOXAPARIN SODIUM 30 MG/0.3ML ~~LOC~~ SOLN
30.0000 mg | Freq: Two times a day (BID) | SUBCUTANEOUS | Status: AC
  Administered 2017-11-13 – 2017-11-14 (×3): 30 mg via SUBCUTANEOUS
  Filled 2017-11-13 (×3): qty 0.3

## 2017-11-13 MED ORDER — OXYCODONE HCL 5 MG PO TABS
10.0000 mg | ORAL_TABLET | ORAL | Status: DC | PRN
  Administered 2017-11-13 (×2): 10 mg via ORAL
  Administered 2017-11-13: 15 mg via ORAL
  Administered 2017-11-14 – 2017-11-16 (×13): 10 mg via ORAL
  Administered 2017-11-16 (×2): 20 mg via ORAL
  Filled 2017-11-13: qty 3
  Filled 2017-11-13 (×2): qty 2
  Filled 2017-11-13: qty 4
  Filled 2017-11-13 (×5): qty 2
  Filled 2017-11-13: qty 4
  Filled 2017-11-13 (×9): qty 2

## 2017-11-13 MED ORDER — SODIUM CHLORIDE 0.9 % IV BOLUS (SEPSIS)
250.0000 mL | Freq: Once | INTRAVENOUS | Status: AC
  Administered 2017-11-13: 250 mL via INTRAVENOUS

## 2017-11-13 NOTE — Anesthesia Post-op Follow-up Note (Signed)
  Anesthesia Pain Follow-up Note  Patient: Alicia Mcbride  Day #: 2  Date of Follow-up: 11/13/2017 Time: 10:12 AM  Last Vitals:  Vitals:   11/13/17 0308 11/13/17 0522  BP:  118/69  Pulse:  92  Resp:  15  Temp:  37.7 C  SpO2: 98% 97%    Level of Consciousness: alert  Pain: mild   Side Effects:None  Catheter Site Exam:dry, no drainage  Anti-Coag Meds (From admission, onward)   Start     Dose/Rate Route Frequency Ordered Stop   11/13/17 2200  enoxaparin (LOVENOX) injection 30 mg    Comments:  Will start Lovenox today and continue until Sunday.  On Sunday, DC Lovenox and start Xarelto 10 mg daily for four weeks. Thanks!   30 mg Subcutaneous Every 12 hours 11/13/17 1008         Plan: Catheter removed/tip intact at surgeon's request  Barnet Glasgow

## 2017-11-13 NOTE — Evaluation (Signed)
Occupational Therapy Evaluation Patient Details Name: Alicia Mcbride MRN: [id number] DOB: 1951/07/22 Today's Date: 11/13/2017    History of Present Illness Pt s/p bil TKR    Clinical Impression   Pt is s/p B TKA resulting in the deficits listed below (see OT Problem List). Pt will benefit from skilled OT to increase their safety and independence with ADL and functional mobility for ADL to facilitate discharge to venue listed below.      Follow Up Recommendations  CIR    Equipment Recommendations  None recommended by OT    Recommendations for Other Services       Precautions / Restrictions Precautions Precautions: Knee Required Braces or Orthoses: Knee Immobilizer - Right;Knee Immobilizer - Left Knee Immobilizer - Right: Discontinue once straight leg raise with < 10 degree lag Knee Immobilizer - Left: Discontinue once straight leg raise with < 10 degree lag Restrictions Weight Bearing Restrictions: No Other Position/Activity Restrictions: WBAT      Mobility Bed Mobility               General bed mobility comments: pt in chair  Transfers Overall transfer level: Needs assistance               General transfer comment: did not perform        ADL either performed or assessed with clinical judgement   ADL Overall ADL's : Needs assistance/impaired Eating/Feeding: Set up;Sitting   Grooming: Set up;Sitting   Upper Body Bathing: Set up;Sitting   Lower Body Bathing: Total assistance;Sitting/lateral leans   Upper Body Dressing : Set up;Sitting   Lower Body Dressing: Total assistance;Sitting/lateral leans                 General ADL Comments: did not stand due to low BP.  Educated pt regarding AE for dressing. Pt vert cold and shivering.  Adjusted temp and brought pt warm blankets.      Vision Patient Visual Report: No change from baseline              Pertinent Vitals/Pain Pain Assessment: 0-10 Pain Score: 2  Pain Location: knees Pain  Descriptors / Indicators: Sore Pain Intervention(s): Limited activity within patient's tolerance;Repositioned     Hand Dominance     Extremity/Trunk Assessment Upper Extremity Assessment Upper Extremity Assessment: Overall WFL for tasks assessed           Communication Communication Communication: HOH   Cognition Arousal/Alertness: Awake/alert Behavior During Therapy: WFL for tasks assessed/performed Overall Cognitive Status: Within Functional Limits for tasks assessed                                     General Comments   pt will need inpt rehab prior to DC to increase I            Home Living Family/patient expects to be discharged to:: Inpatient rehab                                        Prior Functioning/Environment Level of Independence: Independent;Independent with assistive device(s)                 OT Problem List: Decreased strength;Decreased safety awareness;Decreased activity tolerance;Decreased knowledge of use of DME or AE      OT Treatment/Interventions: Self-care/ADL training;Patient/family education;DME and/or AE instruction  OT Goals(Current goals can be found in the care plan section) Acute Rehab OT Goals Patient Stated Goal: rehab and then home  OT Goal Formulation: With patient Time For Goal Achievement: 11/27/17 Potential to Achieve Goals: Good  OT Frequency: Min 2X/week   Barriers to D/C:               AM-PAC PT "6 Clicks" Daily Activity     Outcome Measure Help from another person eating meals?: None Help from another person taking care of personal grooming?: None Help from another person toileting, which includes using toliet, bedpan, or urinal?: Total Help from another person bathing (including washing, rinsing, drying)?: A Lot Help from another person to put on and taking off regular upper body clothing?: A Little Help from another person to put on and taking off regular lower body  clothing?: Total 6 Click Score: 15   End of Session Equipment Utilized During Treatment: Right knee immobilizer;Left knee immobilizer Nurse Communication: Mobility status  Activity Tolerance: Patient tolerated treatment well Patient left: in chair  OT Visit Diagnosis: Unsteadiness on feet (R26.81);Pain Pain - Right/Left: Left Pain - part of body: Knee                Time: 9179-1505 OT Time Calculation (min): 12 min Charges:  OT General Charges $OT Visit: 1 Visit OT Evaluation $OT Eval Low Complexity: 1 Low G-Codes:     Kari Baars, [place]  Payton Mccallum D 11/13/2017, 1:25 PM

## 2017-11-13 NOTE — Progress Notes (Signed)
[facility] inpatient rehab admissions - I met with patient this am.  I gave her rehab booklets and answered many questions about rehab.  I have called and opened the case with [employer] anticipating possible medical readiness on Monday.  I will follow up Monday morning for potential acute inpatient rehab admission pending insurance authorization.  Call me for questions.  #[phone] 

## 2017-11-13 NOTE — Discharge Instructions (Addendum)
° °Dr. Frank Aluisio °Total Joint Specialist °[facility] °[address] °[place], [place] [address] °[phone] ° °TOTAL KNEE REPLACEMENT POSTOPERATIVE DIRECTIONS ° °Knee Rehabilitation, Guidelines Following Surgery  °Results after knee surgery are often greatly improved when you follow the exercise, range of motion and muscle strengthening exercises prescribed by your doctor. Safety measures are also important to protect the knee from further injury. Any time any of these exercises cause you to have increased pain or swelling in your knee joint, decrease the amount until you are comfortable again and slowly increase them. If you have problems or questions, call your caregiver or physical therapist for advice.  ° °HOME CARE INSTRUCTIONS  °Remove items at home which could result in a fall. This includes throw rugs or furniture in walking pathways.  °· ICE to the affected knee every three hours for 30 minutes at a time and then as needed for pain and swelling.  Continue to use ice on the knee for pain and swelling from surgery. You may notice swelling that will progress down to the foot and ankle.  This is normal after surgery.  Elevate the leg when you are not up walking on it.   °· Continue to use the breathing machine which will help keep your temperature down.  It is common for your temperature to cycle up and down following surgery, especially at night when you are not up moving around and exerting yourself.  The breathing machine keeps your lungs expanded and your temperature down. °· Do not place pillow under knee, focus on keeping the knee straight while resting ° °DIET °You may resume your previous home diet once your are discharged from the hospital. ° °DRESSING / WOUND CARE / SHOWERING °You may shower 3 days after surgery, but keep the wounds dry during showering.  You may use an occlusive plastic wrap (Press'n Seal for example), NO SOAKING/SUBMERGING IN THE BATHTUB.  If the  bandage gets wet, change with a clean dry gauze.  If the incision gets wet, pat the wound dry with a clean towel. °You may start showering once you are discharged home but do not submerge the incision under water. Just pat the incision dry and apply a dry gauze dressing on daily. °Change the surgical dressing daily and reapply a dry dressing each time. ° °ACTIVITY °Walk with your walker as instructed. °Use walker as long as suggested by your caregivers. °Avoid periods of inactivity such as sitting longer than an hour when not asleep. This helps prevent blood clots.  °You may resume a sexual relationship in one month or when given the OK by your doctor.  °You may return to work once you are cleared by your doctor.  °Do not drive a car for 6 weeks or until released by you surgeon.  °Do not drive while taking narcotics. ° °WEIGHT BEARING °Weight bearing as tolerated with assist device (walker, cane, etc) as directed, use it as long as suggested by your surgeon or therapist, typically at least 4-6 weeks. ° °POSTOPERATIVE CONSTIPATION PROTOCOL °Constipation - defined medically as fewer than three stools per week and severe constipation as less than one stool per week. ° °One of the most common issues patients have following surgery is constipation.  Even if you have a regular bowel pattern at home, your normal regimen is likely to be disrupted due to multiple reasons following surgery.  Combination of anesthesia, postoperative narcotics, change in appetite and fluid intake all can affect your bowels.    In order to avoid complications following surgery, here are some recommendations in order to help you during your recovery period. ° °Colace (docusate) - Pick up an over-the-counter form of Colace or another stool softener and take twice a day as long as you are requiring postoperative pain medications.  Take with a full glass of water daily.  If you experience loose stools or diarrhea, hold the colace until you stool forms  back up.  If your symptoms do not get better within 1 week or if they get worse, check with your doctor. ° °Dulcolax (bisacodyl) - Pick up over-the-counter and take as directed by the product packaging as needed to assist with the movement of your bowels.  Take with a full glass of water.  Use this product as needed if not relieved by Colace only.  ° °MiraLax (polyethylene glycol) - Pick up over-the-counter to have on hand.  MiraLax is a solution that will increase the amount of water in your bowels to assist with bowel movements.  Take as directed and can mix with a glass of water, juice, soda, coffee, or tea.  Take if you go more than two days without a movement. °Do not use MiraLax more than once per day. Call your doctor if you are still constipated or irregular after using this medication for 7 days in a row. ° °If you continue to have problems with postoperative constipation, please contact the office for further assistance and recommendations.  If you experience "the worst abdominal pain ever" or develop nausea or vomiting, please contact the office immediatly for further recommendations for treatment. ° °ITCHING ° If you experience itching with your medications, try taking only a single pain pill, or even half a pain pill at a time.  You can also use Benadryl over the counter for itching or also to help with sleep.  ° °TED HOSE STOCKINGS °Wear the elastic stockings on both legs for three weeks following surgery during the day but you may remove then at night for sleeping. ° °MEDICATIONS °See your medication summary on the “After Visit Summary” that the nursing staff will review with you prior to discharge.  You may have some home medications which will be placed on hold until you complete the course of blood thinner medication.  It is important for you to complete the blood thinner medication as prescribed by your surgeon.  Continue your approved medications as instructed at time of  discharge. ° °PRECAUTIONS °If you experience chest pain or shortness of breath - call 911 immediately for transfer to the hospital emergency department.  °If you develop a fever greater that 101 F, purulent drainage from wound, increased redness or drainage from wound, foul odor from the wound/dressing, or calf pain - CONTACT YOUR SURGEON.   °                                                °FOLLOW-UP APPOINTMENTS °Make sure you keep all of your appointments after your operation with your surgeon and caregivers. You should call the office at the above phone number and make an appointment for approximately two weeks after the date of your surgery or on the date instructed by your surgeon outlined in the "After Visit Summary". ° ° °RANGE OF MOTION AND STRENGTHENING EXERCISES  °Rehabilitation of the knee is important following a knee injury or   an operation. After just a few days of immobilization, the muscles of the thigh which control the knee become weakened and shrink (atrophy). Knee exercises are designed to build up the tone and strength of the thigh muscles and to improve knee motion. Often times heat used for twenty to thirty minutes before working out will loosen up your tissues and help with improving the range of motion but do not use heat for the first two weeks following surgery. These exercises can be done on a training (exercise) mat, on the floor, on a table or on a bed. Use what ever works the best and is most comfortable for you Knee exercises include:  Leg Lifts - While your knee is still immobilized in a splint or cast, you can do straight leg raises. Lift the leg to 60 degrees, hold for 3 sec, and slowly lower the leg. Repeat 10-20 times 2-3 times daily. Perform this exercise against resistance later as your knee gets better.  Quad and Hamstring Sets - Tighten up the muscle on the front of the thigh (Quad) and hold for 5-10 sec. Repeat this 10-20 times hourly. Hamstring sets are done by pushing the  foot backward against an object and holding for 5-10 sec. Repeat as with quad sets.   Leg Slides: Lying on your back, slowly slide your foot toward your buttocks, bending your knee up off the floor (only go as far as is comfortable). Then slowly slide your foot back down until your leg is flat on the floor again.  Angel Wings: Lying on your back spread your legs to the side as far apart as you can without causing discomfort.  A rehabilitation program following serious knee injuries can speed recovery and prevent re-injury in the future due to weakened muscles. Contact your doctor or a physical therapist for more information on knee rehabilitation.   IF YOU ARE TRANSFERRED TO A SKILLED REHAB FACILITY If the patient is transferred to a skilled rehab facility following release from the hospital, a list of the current medications will be sent to the facility for the patient to continue.  When discharged from the skilled rehab facility, please have the facility set up the patient's [place] prior to being released. Also, the skilled facility will be responsible for providing the patient with their medications at time of release from the facility to include their pain medication, the muscle relaxants, and their blood thinner medication. If the patient is still at the rehab facility at time of the two week follow up appointment, the skilled rehab facility will also need to assist the patient in arranging follow up appointment in our office and any transportation needs.  MAKE SURE YOU:  Understand these instructions.  Get help right away if you are not doing well or get worse.    Pick up stool softner and laxative for home use following surgery while on pain medications. Do not submerge incision under water. Please use good hand washing techniques while changing dressing each day. May shower starting three days after surgery. Please use a clean towel to pat the incision dry following  showers. Continue to use ice for pain and swelling after surgery. Do not use any lotions or creams on the incision until instructed by your surgeon.  Take Xarelto daily for four weeks. Once the patient has completed the blood thinner regimen, then take a Baby 81 mg Aspirin daily for three more weeks.   Information on my medicine - XARELTO (Rivaroxaban)  This medication education was reviewed with me or my healthcare representative as part of my discharge preparation.  The pharmacist that spoke with me during my hospital stay was:  Henreitta Leber.,  PharmD  Why was Xarelto prescribed for you? Xarelto was prescribed for you to reduce the risk of blood clots forming after orthopedic surgery. The medical term for these abnormal blood clots is venous thromboembolism (VTE).  What do you need to know about xarelto ? Take your Xarelto ONCE DAILY at the same time every day. You may take it either with or without food.  If you have difficulty swallowing the tablet whole, you may crush it and mix in applesauce just prior to taking your dose.  Take Xarelto exactly as prescribed by your doctor and DO NOT stop taking Xarelto without talking to the doctor who prescribed the medication.  Stopping without other VTE prevention medication to take the place of Xarelto may increase your risk of developing a clot.  After discharge, you should have regular check-up appointments with your healthcare provider that is prescribing your Xarelto.    What do you do if you miss a dose? If you miss a dose, take it as soon as you remember on the same day then continue your regularly scheduled once daily regimen the next day. Do not take two doses of Xarelto on the same day.   Important Safety Information A possible side effect of Xarelto is bleeding. You should call your healthcare provider right away if you experience any of the following: ? Bleeding from an injury or your nose that does not stop. ? Unusual  colored urine (red or dark brown) or unusual colored stools (red or black). ? Unusual bruising for unknown reasons. ? A serious fall or if you hit your head (even if there is no bleeding).  Some medicines may interact with Xarelto and might increase your risk of bleeding while on Xarelto. To help avoid this, consult your healthcare provider or pharmacist prior to using any new prescription or non-prescription medications, including herbals, vitamins, non-steroidal anti-inflammatory drugs (NSAIDs) and supplements.  This website has more information on Xarelto: [email or web address]

## 2017-11-13 NOTE — Addendum Note (Signed)
Addendum  created 11/13/17 1016 by Barnet Glasgow, MD   Sign clinical note

## 2017-11-13 NOTE — Progress Notes (Signed)
Subjective: 2 Days Post-Op Procedure(s) (LRB): TOTAL KNEE BILATERAL (Bilateral) Patient reports pain as mild and moderate.   Patient seen in rounds with Dr. Wynelle Link. Patient is having problems with pain in the knees, requiring pain medications.  Will increase the dosing on Oxycodone. Epidural to come out today.  Anticipate possible increase in pain temporarily after the epidural has been removed.  Will notify Anesthesia - INR is 1.52 this morning though.  Will defer to Anesthesia about pulling now or rechecking INR.  Will DC Warfarin at this time. Once Epidural is able to come out, will start Lovenox 30 BID for coverage while in house, and then convert over to [place] prior to discharge. Plan is to go Rehab after hospital stay.  Objective: Vital signs in last 24 hours: Temp:  [98 F (36.7 C)-99.9 F (37.7 C)] 99.9 F (37.7 C) (01/04 0522) Pulse Rate:  [87-96] 92 (01/04 0522) Resp:  [15-18] 15 (01/04 0522) BP: (118-135)/(68-75) 118/69 (01/04 0522) SpO2:  [96 %-98 %] 97 % (01/04 0522)  Intake/Output from previous day:  Intake/Output Summary (Last 24 hours) at 11/13/2017 0750 Last data filed at 11/13/2017 0600 Gross per 24 hour  Intake 2400 ml  Output 2250 ml  Net 150 ml    Intake/Output this shift: No intake/output data recorded.  Labs: Recent Labs    11/12/17 0542 11/13/17 0539  HGB 10.9* 10.0*   Recent Labs    11/12/17 0542 11/13/17 0539  WBC 13.8* 14.6*  RBC 3.68* 3.32*  HCT 32.5* 29.5*  PLT 279 246   Recent Labs    11/12/17 0542 11/13/17 0539  NA 137 137  K 4.3 3.6  CL 104 104  BUN 14 12  CREATININE 0.52 0.58  GLUCOSE 142* 114*  CALCIUM 8.2* 8.2*   Recent Labs    11/12/17 0542 11/13/17 0539  INR 1.24 1.52    EXAM General - Patient is Alert, Appropriate and Oriented Extremity - Sensation intact distally Intact pulses distally Dorsiflexion/Plantar flexion intact Dressing/Incision - clean, dry, no drainage to both knee incisions Motor Function  - intact, moving feet and toes well on exam.    Past Medical History:  Diagnosis Date  . Allergy   . Gynecological examination    sees Dr. Alden Hipp  . Hypertension   . PONV (postoperative nausea and vomiting)     Assessment/Plan: 2 Days Post-Op Procedure(s) (LRB): TOTAL KNEE BILATERAL (Bilateral) Principal Problem:   OA (osteoarthritis) of knee  Estimated body mass index is 29.94 kg/m as calculated from the following:   Height as of this encounter: 5' 7.5" (1.715 m).   Weight as of this encounter: 88 kg (194 lb). Up with therapy Continue foley due to urinary output monitoring and they still has their epidural in place.  Will remove the catheter six hours after the epidural is removed.  Will need to note in chart when the epidural is pulled.  DVT Prophylaxis - Lovenox will not start until later this afternoon though after the epidural has been removed for twelve hours.  Will need to note in chart when the epidural is pulled. Anticipate possible increase in pain temporarily after the epidural has been removed.  Weight-Bearing as tolerated to both legs  Lovenox injections will start later this evening after the epidural has been removed and continue until the INR is therapeutic at or greater than 2.0.  Will continue Lovenox injections twice a day while in house.  Likely convert Lovenox over to Xarelto daily prior to discharge.  INR 1.52 this morning.  Arlee Muslim, PA-C Orthopaedic Surgery 11/13/2017, 7:50 AM

## 2017-11-13 NOTE — Progress Notes (Signed)
Physical Therapy Treatment Patient Details Name: Alicia Mcbride MRN: [id number] DOB: [birth year] Today's Date: 11/13/2017    History of Present Illness Pt s/p bil TKR     PT Comments    POD # 2 am session Pt reports "bad night" very little sleep.  Epidural pulled this morning pt c/o instant  increased pain.  Had to wait till after another round of pain meds after 11 am.  Applied B KI and instructed on use and proper application.  Assisted from supine to EOB + 2 assist and increased assist due to increased c/o pain.  Sat EOB x 7 min due to mild c/o dizziness.  BP 113/86.  Assisted with standing + 2 side by side assist to attempt amb.  Educated pt how to "hip hick" to off load in order to advance either LE.  Pt was only able to amb 2 feet when she became pale in color and c/o MAX dizziness.  Recliner brought to pt from behind and immediately took BP at 87/46.  Positioned in recliner B LE elevated semi reclined and reported to RN.  Applied ICE to B knees.   Follow Up Recommendations  CIR     Equipment Recommendations  None recommended by PT    Recommendations for Other Services       Precautions / Restrictions Precautions Precautions: Knee Precaution Comments: instructed no pillow under knees Required Braces or Orthoses: Knee Immobilizer - Right;Knee Immobilizer - Left Knee Immobilizer - Right: Discontinue once straight leg raise with < 10 degree lag Knee Immobilizer - Left: Discontinue once straight leg raise with < 10 degree lag Restrictions Weight Bearing Restrictions: No Other Position/Activity Restrictions: WBAT    Mobility  Bed Mobility Overal bed mobility: Needs Assistance Bed Mobility: Supine to Sit     Supine to sit: Mod assist;Max assist;+2 for physical assistance;+2 for safety/equipment     General bed mobility comments: required increased assist due to increased c/o pain  Transfers Overall transfer level: Needs assistance Equipment used: Rolling walker (2  wheeled) Transfers: Sit to/from Stand Sit to Stand: Max assist;Mod assist;+2 physical assistance;+2 safety/equipment;From elevated surface         General transfer comment: + 2 side by side assist from elvated bed to fullu upright.    Ambulation/Gait Ambulation/Gait assistance: Mod assist;+2 physical assistance;+2 safety/equipment Ambulation Distance (Feet): 2 Feet Assistive device: Rolling walker (2 wheeled) Gait Pattern/deviations: Step-to pattern;Decreased step length - right;Decreased step length - left;Shuffle;Trunk flexed Gait velocity: decr   General Gait Details: distance limited by drop in BP and max c/o feeling "faint" recliner brought to pt from behind.  see vitals section in EPIC   Stairs            Wheelchair Mobility    Modified Rankin (Stroke Patients Only)       Balance                                            Cognition Arousal/Alertness: Awake/alert Behavior During Therapy: WFL for tasks assessed/performed Overall Cognitive Status: Within Functional Limits for tasks assessed                                        Exercises      General Comments  Pertinent Vitals/Pain Pain Assessment: 0-10 Pain Score: 8  Pain Location: B knees L>R Pain Descriptors / Indicators: Grimacing;Operative site guarding;Tightness Pain Intervention(s): Monitored during session;Repositioned;Ice applied;Premedicated before session    Home Living Family/patient expects to be discharged to:: Inpatient rehab                    Prior Function Level of Independence: Independent;Independent with assistive device(s)          PT Goals (current goals can now be found in the care plan section) Acute Rehab PT Goals Patient Stated Goal: rehab and then home  Progress towards PT goals: Progressing toward goals    Frequency    7X/week      PT Plan Current plan remains appropriate    Co-evaluation               AM-PAC PT "6 Clicks" Daily Activity  Outcome Measure  Difficulty turning over in bed (including adjusting bedclothes, sheets and blankets)?: Unable Difficulty moving from lying on back to sitting on the side of the bed? : Unable Difficulty sitting down on and standing up from a chair with arms (e.g., wheelchair, bedside commode, etc,.)?: Unable Help needed moving to and from a bed to chair (including a wheelchair)?: A Lot Help needed walking in hospital room?: A Lot Help needed climbing 3-5 steps with a railing? : Total 6 Click Score: 8    End of Session Equipment Utilized During Treatment: Gait belt;Right knee immobilizer;Left knee immobilizer Activity Tolerance: Other (comment)(limited bt drop in BP)   Nurse Communication: Mobility status PT Visit Diagnosis: Difficulty in walking, not elsewhere classified (R26.2)     Time: 1448-1856 PT Time Calculation (min) (ACUTE ONLY): 25 min  Charges:  $Gait Training: 8-22 mins $Therapeutic Activity: 8-22 mins                    G Codes:       {Johnchristopher Sarvis  PTA [facility]  Acute  Rehab Pager      [phone]

## 2017-11-13 NOTE — Progress Notes (Signed)
Physical Therapy Treatment Patient Details Name: Alicia Mcbride MRN: [id number] DOB: 07-02-1951 Today's Date: 11/13/2017    History of Present Illness Pt s/p bil TKR     PT Comments    POD # 2 pm session + 2 Assisted from recliner sit to stand long enough to switch recliner with bed from behind as BP's are still "soft" reports RN.  Assisted to supine and removed B KI's to perform TKR TE's to tolerance.  Applied CPM to R LE 10 - 40 degrees.  Applied ICE to B knees.  Pt progressing slowly and will need ST Rehab at [facility].  Follow Up Recommendations  [facility]     Equipment Recommendations  None recommended by PT    Recommendations for Other Services       Precautions / Restrictions Precautions Precautions: Knee Precaution Comments: instructed no pillow under knees Required Braces or Orthoses: Knee Immobilizer - Right;Knee Immobilizer - Left Knee Immobilizer - Right: Discontinue once straight leg raise with < 10 degree lag Knee Immobilizer - Left: Discontinue once straight leg raise with < 10 degree lag Restrictions Weight Bearing Restrictions: No Other Position/Activity Restrictions: WBAT    Mobility  Bed Mobility Overal bed mobility: Needs Assistance Bed Mobility: Sit to Supine     Supine to sit: Mod assist;Max assist;+2 for physical assistance;+2 for safety/equipment Sit to supine: Max assist;Total assist;+2 for physical assistance;+2 for safety/equipment   General bed mobility comments: assited back to bed  Transfers Overall transfer level: Needs assistance Equipment used: Rolling walker (2 wheeled) Transfers: Sit to/from Stand Sit to Stand: Max assist;Mod assist;+2 physical assistance;+2 safety/equipment;From elevated surface         General transfer comment: + 2 side by side assist from recliner to bed  Ambulation/Gait Stairs            Wheelchair Mobility    Modified Rankin (Stroke Patients Only)       Balance                                             Cognition Arousal/Alertness: Awake/alert Behavior During Therapy: WFL for tasks assessed/performed Overall Cognitive Status: Within Functional Limits for tasks assessed                                        Exercises   Total Knee Replacement TE's 10 reps B LE ankle pumps 10 reps towel squeezes 10 reps knee presses   5 reps heel slides  AAROM  approx 30 degrees flex 10 reps SLR's AAROM 10 reps ABD AAROM Followed by ICE     General Comments        Pertinent Vitals/Pain Pain Assessment: 0-10 Pain Score: 8  Pain Location: B knees L>R Pain Descriptors / Indicators: Grimacing;Operative site guarding;Tightness Pain Intervention(s): Monitored during session;Repositioned;Ice applied;Premedicated before session    Home Living Family/patient expects to be discharged to:: Inpatient rehab                    Prior Function Level of Independence: Independent;Independent with assistive device(s)          PT Goals (current goals can now be found in the care plan section) Acute Rehab PT Goals Patient Stated Goal: rehab and then home  Progress towards PT goals: Progressing toward  goals    Frequency    7X/week      PT Plan Current plan remains appropriate    Co-evaluation              AM-PAC PT "6 Clicks" Daily Activity  Outcome Measure  Difficulty turning over in bed (including adjusting bedclothes, sheets and blankets)?: Unable Difficulty moving from lying on back to sitting on the side of the bed? : Unable Difficulty sitting down on and standing up from a chair with arms (e.g., wheelchair, bedside commode, etc,.)?: Unable Help needed moving to and from a bed to chair (including a wheelchair)?: A Lot Help needed walking in hospital room?: A Lot Help needed climbing 3-5 steps with a railing? : Total 6 Click Score: 8    End of Session Equipment Utilized During Treatment: Gait belt;Right knee immobilizer;Left knee  immobilizer Activity Tolerance: Other (comment)(limited bt drop in BP)   Nurse Communication: Mobility status PT Visit Diagnosis: Difficulty in walking, not elsewhere classified (R26.2)     Time: 1430-1510 PT Time Calculation (min) (ACUTE ONLY): 40 min  Charges:  $Gait Training: 8-22 mins $Therapeutic Exercise: 8-22 mins $Therapeutic Activity: 8-22 mins                    G Codes:       Rica Koyanagi  PTA [facility]  Acute  Rehab Pager      [phone]

## 2017-11-14 LAB — CBC
HCT: 31.3 % — ABNORMAL LOW (ref 36.0–46.0)
HEMOGLOBIN: 10.3 g/dL — AB (ref 12.0–15.0)
MCH: 29 pg (ref 26.0–34.0)
MCHC: 32.9 g/dL (ref 30.0–36.0)
MCV: 88.2 fL (ref 78.0–100.0)
Platelets: 315 10*3/uL (ref 150–400)
RBC: 3.55 MIL/uL — AB (ref 3.87–5.11)
RDW: 12.9 % (ref 11.5–15.5)
WBC: 13.5 10*3/uL — AB (ref 4.0–10.5)

## 2017-11-14 LAB — PROTIME-INR
INR: 1.3
PROTHROMBIN TIME: 16.1 s — AB (ref 11.4–15.2)

## 2017-11-14 MED ORDER — RIVAROXABAN 10 MG PO TABS
10.0000 mg | ORAL_TABLET | Freq: Every day | ORAL | Status: DC
  Administered 2017-11-15 – 2017-11-16 (×2): 10 mg via ORAL
  Filled 2017-11-14 (×2): qty 1

## 2017-11-14 NOTE — Progress Notes (Signed)
Physical Therapy Treatment Patient Details Name: Alicia Mcbride MRN: [id number] DOB: Nov 15, 1950 Today's Date: 11/14/2017    History of Present Illness Pt s/p bil TKR     PT Comments    Marked improvement in activity tolerance this am with pt ambulating short distance into hall.  Pt continues to require increased time but decreased physical assistance for all mobility tasks.   Follow Up Recommendations  [facility]     Equipment Recommendations  None recommended by PT    Recommendations for Other Services OT consult     Precautions / Restrictions Precautions Precautions: Knee Required Braces or Orthoses: Knee Immobilizer - Right;Knee Immobilizer - Left Knee Immobilizer - Right: Discontinue once straight leg raise with < 10 degree lag Knee Immobilizer - Left: Discontinue once straight leg raise with < 10 degree lag Restrictions Weight Bearing Restrictions: No Other Position/Activity Restrictions: WBAT    Mobility  Bed Mobility Overal bed mobility: Needs Assistance Bed Mobility: Supine to Sit     Supine to sit: Min assist;Mod assist     General bed mobility comments: cues for sequence and assist for bil LEs.  Pt utilizing bedrail to move to sitting  Transfers Overall transfer level: Needs assistance Equipment used: Rolling walker (2 wheeled) Transfers: Sit to/from Stand Sit to Stand: +2 physical assistance;+2 safety/equipment;From elevated surface;Min assist;Mod assist         General transfer comment: cues for sequence, use of UEs to self assist and LE management  Ambulation/Gait Ambulation/Gait assistance: Min assist;+2 physical assistance;+2 safety/equipment Ambulation Distance (Feet): 27 Feet Assistive device: Rolling walker (2 wheeled) Gait Pattern/deviations: Step-to pattern;Decreased step length - right;Decreased step length - left;Shuffle;Trunk flexed Gait velocity: decr Gait velocity interpretation: Below normal speed for age/gender General Gait Details:  cues for sequence, posture and position from [employer]            Wheelchair Mobility    Modified Rankin (Stroke Patients Only)       Balance Overall balance assessment: Needs assistance Sitting-balance support: Feet supported;No upper extremity supported Sitting balance-Leahy Scale: Fair     Standing balance support: Bilateral upper extremity supported Standing balance-Leahy Scale: Poor                              Cognition Arousal/Alertness: Awake/alert Behavior During Therapy: WFL for tasks assessed/performed Overall Cognitive Status: Within Functional Limits for tasks assessed                                        Exercises Total Joint Exercises Ankle Circles/Pumps: AROM;Both;20 reps;Supine Quad Sets: AROM;Both;Supine;15 reps Heel Slides: AAROM;Both;Supine;15 reps Straight Leg Raises: AAROM;Right;Supine;20 reps    General Comments        Pertinent Vitals/Pain Pain Assessment: 0-10 Pain Score: 4  Pain Location: B knees L>R Pain Descriptors / Indicators: Aching;Sore Pain Intervention(s): Limited activity within patient's tolerance;Monitored during session;Premedicated before session;Ice applied    Home Living                      Prior Function            PT Goals (current goals can now be found in the care plan section) Acute Rehab PT Goals Patient Stated Goal: rehab and then home  PT Goal Formulation: With patient Time For Goal Achievement: 11/19/17 Potential to Achieve Goals: Good  Progress towards PT goals: Progressing toward goals    Frequency    7X/week      PT Plan Current plan remains appropriate    Co-evaluation              AM-PAC PT "6 Clicks" Daily Activity  Outcome Measure  Difficulty turning over in bed (including adjusting bedclothes, sheets and blankets)?: Unable Difficulty moving from lying on back to sitting on the side of the bed? : Unable Difficulty sitting down on  and standing up from a chair with arms (e.g., wheelchair, bedside commode, etc,.)?: Unable Help needed moving to and from a bed to chair (including a wheelchair)?: A Lot Help needed walking in hospital room?: A Little Help needed climbing 3-5 steps with a railing? : Total 6 Click Score: 9    End of Session Equipment Utilized During Treatment: Gait belt;Right knee immobilizer;Left knee immobilizer Activity Tolerance: Patient tolerated treatment well Patient left: in chair;with call bell/phone within reach Nurse Communication: Mobility status PT Visit Diagnosis: Difficulty in walking, not elsewhere classified (R26.2)     Time: 5809-9833 PT Time Calculation (min) (ACUTE ONLY): 39 min  Charges:  $Gait Training: 8-22 mins $Therapeutic Exercise: 23-37 mins                    G Codes:       Pg [phone]    Wilhelmena Zea 11/14/2017, 12:53 PM

## 2017-11-14 NOTE — Progress Notes (Signed)
Physical Therapy Treatment Patient Details Name: Alicia Mcbride MRN: [id number] DOB: 1951/05/11 Today's Date: 11/14/2017    History of Present Illness Pt s/p bil TKR     PT Comments    Pt continues very motivated and progressing steadily with mobility.   Follow Up Recommendations  [facility]     Equipment Recommendations  None recommended by PT    Recommendations for Other Services OT consult     Precautions / Restrictions Precautions Precautions: Knee Required Braces or Orthoses: Knee Immobilizer - Right;Knee Immobilizer - Left Knee Immobilizer - Right: Discontinue once straight leg raise with < 10 degree lag Knee Immobilizer - Left: Discontinue once straight leg raise with < 10 degree lag Restrictions Weight Bearing Restrictions: No Other Position/Activity Restrictions: WBAT    Mobility  Bed Mobility Overal bed mobility: Needs Assistance Bed Mobility: Sit to Supine     Supine to sit: Min assist;Mod assist Sit to supine: Min assist;Mod assist   General bed mobility comments: cues for sequence and assist for bil LEs.   Transfers Overall transfer level: Needs assistance Equipment used: Rolling walker (2 wheeled) Transfers: Sit to/from Stand Sit to Stand: +2 physical assistance;+2 safety/equipment;From elevated surface;Min assist;Mod assist Stand pivot transfers: +2 physical assistance;+2 safety/equipment;From elevated surface;Min assist;Mod assist       General transfer comment: cues for sequence, use of UEs to self assist and LE management  Ambulation/Gait Ambulation/Gait assistance: Min assist;+2 physical assistance;+2 safety/equipment Ambulation Distance (Feet): 36 Feet Assistive device: Rolling walker (2 wheeled) Gait Pattern/deviations: Step-to pattern;Decreased step length - right;Decreased step length - left;Shuffle;Trunk flexed Gait velocity: decr Gait velocity interpretation: Below normal speed for age/gender General Gait Details: cues for sequence,  posture and position from [employer]            Wheelchair Mobility    Modified Rankin (Stroke Patients Only)       Balance Overall balance assessment: Needs assistance Sitting-balance support: Feet supported;No upper extremity supported Sitting balance-Leahy Scale: Fair     Standing balance support: Bilateral upper extremity supported Standing balance-Leahy Scale: Poor                              Cognition Arousal/Alertness: Awake/alert Behavior During Therapy: WFL for tasks assessed/performed Overall Cognitive Status: Within Functional Limits for tasks assessed                                        Exercises Total Joint Exercises Ankle Circles/Pumps: AROM;Both;20 reps;Supine Quad Sets: AROM;Both;Supine;15 reps Heel Slides: AAROM;Both;Supine;15 reps Straight Leg Raises: AAROM;Right;Supine;20 reps    General Comments        Pertinent Vitals/Pain Pain Assessment: 0-10 Pain Score: 3  Pain Location: B knees L>R Pain Descriptors / Indicators: Aching;Sore Pain Intervention(s): Limited activity within patient's tolerance;Monitored during session;Premedicated before session;Ice applied    Home Living                      Prior Function            PT Goals (current goals can now be found in the care plan section) Acute Rehab PT Goals Patient Stated Goal: rehab and then home  PT Goal Formulation: With patient Time For Goal Achievement: 11/19/17 Potential to Achieve Goals: Good Progress towards PT goals: Progressing toward goals    Frequency  7X/week      PT Plan Current plan remains appropriate    Co-evaluation              AM-PAC PT "6 Clicks" Daily Activity  Outcome Measure  Difficulty turning over in bed (including adjusting bedclothes, sheets and blankets)?: Unable Difficulty moving from lying on back to sitting on the side of the bed? : Unable Difficulty sitting down on and standing up from  a chair with arms (e.g., wheelchair, bedside commode, etc,.)?: Unable Help needed moving to and from a bed to chair (including a wheelchair)?: A Lot Help needed walking in hospital room?: A Little Help needed climbing 3-5 steps with a railing? : Total 6 Click Score: 9    End of Session Equipment Utilized During Treatment: Gait belt;Right knee immobilizer;Left knee immobilizer Activity Tolerance: Patient tolerated treatment well Patient left: in bed;with call bell/phone within reach Nurse Communication: Mobility status PT Visit Diagnosis: Difficulty in walking, not elsewhere classified (R26.2)     Time: 1330-1400 PT Time Calculation (min) (ACUTE ONLY): 30 min  Charges:  $Gait Training: 23-37 mins $Therapeutic Exercise: 23-37 mins                    G Codes:       Pg [phone]    Joshia Kitchings 11/14/2017, 2:33 PM

## 2017-11-14 NOTE — Progress Notes (Signed)
    Subjective: 3 Days Post-Op Procedure(s) (LRB): TOTAL KNEE BILATERAL (Bilateral) Patient reports pain as 3 on 0-10 scale. Just sitting in bed.   Increases with elevation  Denies CP or SOB.  Voiding without difficulty. Positive flatus. Pt reports not eating well. Pt is supposed to go to inpatient rehab.  She was told bt SW it will be Monday.  Objective: Vital signs in last 24 hours: Temp:  [98.6 F (37 C)-99.7 F (37.6 C)] 98.6 F (37 C) (01/05 0536) Pulse Rate:  [85-97] 97 (01/05 0536) Resp:  [15-18] 18 (01/05 0536) BP: (87-129)/(46-86) 111/60 (01/05 0536) SpO2:  [94 %-99 %] 94 % (01/05 0536)  Intake/Output from previous day: 01/04 0701 - 01/05 0700 In: 1080 [P.O.:1080] Out: 1700 [Urine:1700] Intake/Output this shift: No intake/output data recorded.  Labs: Recent Labs    11/12/17 0542 11/13/17 0539 11/14/17 0551  HGB 10.9* 10.0* 10.3*   Recent Labs    11/13/17 0539 11/14/17 0551  WBC 14.6* 13.5*  RBC 3.32* 3.55*  HCT 29.5* 31.3*  PLT 246 315   Recent Labs    11/12/17 0542 11/13/17 0539  NA 137 137  K 4.3 3.6  CL 104 104  CO2 25 27  BUN 14 12  CREATININE 0.52 0.58  GLUCOSE 142* 114*  CALCIUM 8.2* 8.2*   Recent Labs    11/13/17 0539 11/14/17 0551  INR 1.52 1.30    Physical Exam: Neurologically intact ABD soft Sensation intact distally Dorsiflexion/Plantar flexion intact Incision: scant drainage Compartment soft Body mass index is 29.94 kg/m.   Assessment/Plan: 3 Days Post-Op Procedure(s) (LRB): TOTAL KNEE BILATERAL (Bilateral) Advance diet Up with therapy  Move to Inpatient Rehab on Monday Sunday DC Lovenox and start [place], Darla Lesches for Dr. Melina Schools [facility] [phone] 11/14/2017, 7:47 AM

## 2017-11-15 LAB — PROTIME-INR
INR: 1.13
PROTHROMBIN TIME: 14.4 s (ref 11.4–15.2)

## 2017-11-15 LAB — URINALYSIS, ROUTINE W REFLEX MICROSCOPIC
Bilirubin Urine: NEGATIVE
Glucose, UA: NEGATIVE mg/dL
Ketones, ur: NEGATIVE mg/dL
Nitrite: NEGATIVE
PH: 7 (ref 5.0–8.0)
Protein, ur: NEGATIVE mg/dL
SPECIFIC GRAVITY, URINE: 1.005 (ref 1.005–1.030)

## 2017-11-15 NOTE — Progress Notes (Signed)
Physical Therapy Treatment Patient Details Name: Alicia Mcbride MRN: [id number] DOB: 04/10/1951 Today's Date: 11/15/2017    History of Present Illness Pt s/p bil TKR     PT Comments    Pt steadily progressing with all mobility tasks.  This pm ambulated increased distance and with L KI only.  Pt ltd by increasing dizziness - BP 94/63 upon sitting - RN aware.   Follow Up Recommendations  CIR     Equipment Recommendations  None recommended by PT    Recommendations for Other Services OT consult     Precautions / Restrictions Precautions Precautions: Knee Precaution Comments: Reviewed precaution to have no pillows under knees.  Required Braces or Orthoses: Knee Immobilizer - Right;Knee Immobilizer - Left Knee Immobilizer - Right: Discontinue once straight leg raise with < 10 degree lag(Pt performed IND SLR this date) Knee Immobilizer - Left: Discontinue once straight leg raise with < 10 degree lag Restrictions Weight Bearing Restrictions: No Other Position/Activity Restrictions: WBAT    Mobility  Bed Mobility Overal bed mobility: Needs Assistance Bed Mobility: Sit to Supine     Supine to sit: Min assist;Mod assist Sit to supine: Min assist;Mod assist   General bed mobility comments: cues for sequence and assist for bil LEs.   Transfers Overall transfer level: Needs assistance Equipment used: Rolling walker (2 wheeled) Transfers: Sit to/from Stand Sit to Stand: Min assist;Mod assist Stand pivot transfers: Min assist;Mod assist;+2 safety/equipment       General transfer comment: cues for sequence, use of UEs to self assist and LE management.  To/from recliner, to/from [facility] and to EOB  Ambulation/Gait Ambulation/Gait assistance: Min assist;+2 safety/equipment Ambulation Distance (Feet): 55 Feet Assistive device: Rolling walker (2 wheeled) Gait Pattern/deviations: Step-to pattern;Decreased step length - right;Decreased step length - left;Shuffle;Trunk flexed Gait  velocity: decr Gait velocity interpretation: Below normal speed for age/gender General Gait Details: cues for sequence, posture and position from RW.  Ltd by increasing dizziness   Stairs            Wheelchair Mobility    Modified Rankin (Stroke Patients Only)       Balance Overall balance assessment: Needs assistance Sitting-balance support: Feet supported;No upper extremity supported Sitting balance-Leahy Scale: Fair     Standing balance support: Bilateral upper extremity supported Standing balance-Leahy Scale: Poor                              Cognition Arousal/Alertness: Awake/alert Behavior During Therapy: WFL for tasks assessed/performed Overall Cognitive Status: Within Functional Limits for tasks assessed                                        Exercises Total Joint Exercises Ankle Circles/Pumps: AROM;Both;20 reps;Supine Quad Sets: AROM;Both;Supine;15 reps Heel Slides: AAROM;Both;Supine;15 reps Straight Leg Raises: Right;Supine;20 reps;AAROM;AROM Goniometric ROM: AAROM bil knees ~ -10 - 45    General Comments        Pertinent Vitals/Pain Pain Assessment: 0-10 Pain Score: 3  Pain Location: bilateral knee Pain Descriptors / Indicators: Aching Pain Intervention(s): Limited activity within patient's tolerance;Monitored during session;Premedicated before session;Ice applied    Home Living                      Prior Function            PT Goals (current goals can  now be found in the care plan section) Acute Rehab PT Goals Patient Stated Goal: rehab and then home  PT Goal Formulation: With patient Time For Goal Achievement: 11/19/17 Potential to Achieve Goals: Good Progress towards PT goals: Progressing toward goals    Frequency    7X/week      PT Plan Current plan remains appropriate    Co-evaluation              AM-PAC PT "6 Clicks" Daily Activity  Outcome Measure  Difficulty turning over  in bed (including adjusting bedclothes, sheets and blankets)?: Unable Difficulty moving from lying on back to sitting on the side of the bed? : Unable Difficulty sitting down on and standing up from a chair with arms (e.g., wheelchair, bedside commode, etc,.)?: Unable Help needed moving to and from a bed to chair (including a wheelchair)?: A Lot Help needed walking in hospital room?: A Little Help needed climbing 3-5 steps with a railing? : Total 6 Click Score: 9    End of Session Equipment Utilized During Treatment: Gait belt;Left knee immobilizer Activity Tolerance: Patient tolerated treatment well;Patient limited by fatigue Patient left: in bed;with call bell/phone within reach Nurse Communication: Mobility status;Other (comment)(dizziness with ambulation - BP 94/63) PT Visit Diagnosis: Difficulty in walking, not elsewhere classified (R26.2)     Time: 9774-1423 PT Time Calculation (min) (ACUTE ONLY): 35 min  Charges:  $Gait Training: 8-22 mins $Therapeutic Exercise: 8-22 mins $Therapeutic Activity: 8-22 mins                    G Codes:       Pg [phone]    Shakim Faith 11/15/2017, 3:41 PM

## 2017-11-15 NOTE — Progress Notes (Signed)
Physical Therapy Treatment Patient Details Name: Alicia Mcbride MRN: [id number] DOB: 21-Jun-1951 Today's Date: 11/15/2017    History of Present Illness Pt s/p bil TKR     PT Comments    Pt continues motivated and progressing steadily with mobility tasks.   Follow Up Recommendations  CIR     Equipment Recommendations  None recommended by PT    Recommendations for Other Services OT consult     Precautions / Restrictions Precautions Precautions: Knee Precaution Comments: Reviewed precaution to have no pillows under knees.  Required Braces or Orthoses: Knee Immobilizer - Right;Knee Immobilizer - Left Knee Immobilizer - Right: Discontinue once straight leg raise with < 10 degree lag Knee Immobilizer - Left: Discontinue once straight leg raise with < 10 degree lag Restrictions Weight Bearing Restrictions: No Other Position/Activity Restrictions: WBAT    Mobility  Bed Mobility Overal bed mobility: Needs Assistance Bed Mobility: Supine to Sit     Supine to sit: Min assist;Mod assist     General bed mobility comments: cues for sequence and assist for bil LEs.   Transfers Overall transfer level: Needs assistance Equipment used: Rolling walker (2 wheeled) Transfers: Sit to/from Stand Sit to Stand: +2 physical assistance;+2 safety/equipment;From elevated surface;Min assist;Mod assist Stand pivot transfers: +2 physical assistance;+2 safety/equipment;From elevated surface;Min assist;Mod assist       General transfer comment: cues for sequence, use of UEs to self assist and LE management.  From EOB, to/from [facility] and to recliner  Ambulation/Gait Ambulation/Gait assistance: Min assist;+2 safety/equipment Ambulation Distance (Feet): 40 Feet Assistive device: Rolling walker (2 wheeled) Gait Pattern/deviations: Step-to pattern;Decreased step length - right;Decreased step length - left;Shuffle;Trunk flexed Gait velocity: decr Gait velocity interpretation: Below normal speed for  age/gender General Gait Details: cues for sequence, posture and position from [employer]            Wheelchair Mobility    Modified Rankin (Stroke Patients Only)       Balance Overall balance assessment: Needs assistance Sitting-balance support: Feet supported;No upper extremity supported Sitting balance-Leahy Scale: Fair     Standing balance support: Bilateral upper extremity supported Standing balance-Leahy Scale: Poor                              Cognition Arousal/Alertness: Awake/alert Behavior During Therapy: WFL for tasks assessed/performed Overall Cognitive Status: Within Functional Limits for tasks assessed                                        Exercises      General Comments        Pertinent Vitals/Pain Pain Assessment: 0-10 Pain Score: 4  Pain Location: bilateral knee Pain Descriptors / Indicators: Aching Pain Intervention(s): Limited activity within patient's tolerance;Monitored during session;Premedicated before session;Ice applied    Home Living                      Prior Function            PT Goals (current goals can now be found in the care plan section) Acute Rehab PT Goals Patient Stated Goal: rehab and then home  PT Goal Formulation: With patient Time For Goal Achievement: 11/19/17 Potential to Achieve Goals: Good Progress towards PT goals: Progressing toward goals    Frequency    7X/week  PT Plan Current plan remains appropriate    Co-evaluation              AM-PAC PT "6 Clicks" Daily Activity  Outcome Measure  Difficulty turning over in bed (including adjusting bedclothes, sheets and blankets)?: Unable Difficulty moving from lying on back to sitting on the side of the bed? : Unable Difficulty sitting down on and standing up from a chair with arms (e.g., wheelchair, bedside commode, etc,.)?: Unable Help needed moving to and from a bed to chair (including a  wheelchair)?: A Lot Help needed walking in hospital room?: A Little Help needed climbing 3-5 steps with a railing? : Total 6 Click Score: 9    End of Session Equipment Utilized During Treatment: Gait belt;Right knee immobilizer;Left knee immobilizer Activity Tolerance: Patient tolerated treatment well Patient left: in chair;with call bell/phone within reach Nurse Communication: Mobility status PT Visit Diagnosis: Difficulty in walking, not elsewhere classified (R26.2)     Time: 7078-6754 PT Time Calculation (min) (ACUTE ONLY): 30 min  Charges:  $Gait Training: 8-22 mins $Therapeutic Activity: 8-22 mins                    G Codes:       Pg [phone]    Albeiro Trompeter 11/15/2017, 12:20 PM

## 2017-11-15 NOTE — Progress Notes (Signed)
Physical Therapy Treatment Patient Details Name: Alicia Mcbride MRN: [id number] DOB: June 19, 1951 Today's Date: 11/15/2017    History of Present Illness Pt s/p bil TKR     PT Comments    Therex program performed.  Pt able to perform IND SLR L LE.  Will attempt amb sans L KI this pm.   Follow Up Recommendations  CIR     Equipment Recommendations  None recommended by PT    Recommendations for Other Services OT consult     Precautions / Restrictions Precautions Precautions: Knee Precaution Comments: Reviewed precaution to have no pillows under knees.  Required Braces or Orthoses: Knee Immobilizer - Right;Knee Immobilizer - Left Knee Immobilizer - Right: Discontinue once straight leg raise with < 10 degree lag Knee Immobilizer - Left: Discontinue once straight leg raise with < 10 degree lag Restrictions Weight Bearing Restrictions: No Other Position/Activity Restrictions: WBAT    Mobility  Bed Mobility Overal bed mobility: Needs Assistance Bed Mobility: Supine to Sit     Supine to sit: Min assist;Mod assist     General bed mobility comments: cues for sequence and assist for bil LEs.   Transfers Overall transfer level: Needs assistance Equipment used: Rolling walker (2 wheeled) Transfers: Sit to/from Stand Sit to Stand: +2 physical assistance;+2 safety/equipment;From elevated surface;Min assist;Mod assist Stand pivot transfers: +2 physical assistance;+2 safety/equipment;From elevated surface;Min assist;Mod assist       General transfer comment: cues for sequence, use of UEs to self assist and LE management.  From EOB, to/from [facility] and to recliner  Ambulation/Gait Ambulation/Gait assistance: Min assist;+2 safety/equipment Ambulation Distance (Feet): 40 Feet Assistive device: Rolling walker (2 wheeled) Gait Pattern/deviations: Step-to pattern;Decreased step length - right;Decreased step length - left;Shuffle;Trunk flexed Gait velocity: decr Gait velocity  interpretation: Below normal speed for age/gender General Gait Details: cues for sequence, posture and position from [employer]            Wheelchair Mobility    Modified Rankin (Stroke Patients Only)       Balance Overall balance assessment: Needs assistance Sitting-balance support: Feet supported;No upper extremity supported Sitting balance-Leahy Scale: Fair     Standing balance support: Bilateral upper extremity supported Standing balance-Leahy Scale: Poor                              Cognition Arousal/Alertness: Awake/alert Behavior During Therapy: WFL for tasks assessed/performed Overall Cognitive Status: Within Functional Limits for tasks assessed                                        Exercises Total Joint Exercises Ankle Circles/Pumps: AROM;Both;20 reps;Supine Quad Sets: AROM;Both;Supine;15 reps Heel Slides: AAROM;Both;Supine;15 reps Straight Leg Raises: Right;Supine;20 reps;AAROM;AROM Goniometric ROM: AAROM bil knees ~ -10 - 45    General Comments        Pertinent Vitals/Pain Pain Assessment: 0-10 Pain Score: 5  Pain Location: bilateral knee Pain Descriptors / Indicators: Aching Pain Intervention(s): Limited activity within patient's tolerance;Monitored during session;Premedicated before session;Ice applied    Home Living                      Prior Function            PT Goals (current goals can now be found in the care plan section) Acute Rehab PT Goals Patient Stated Goal: rehab  and then home  PT Goal Formulation: With patient Time For Goal Achievement: 11/19/17 Potential to Achieve Goals: Good Progress towards PT goals: Progressing toward goals    Frequency    7X/week      PT Plan Current plan remains appropriate    Co-evaluation              AM-PAC PT "6 Clicks" Daily Activity  Outcome Measure  Difficulty turning over in bed (including adjusting bedclothes, sheets and  blankets)?: Unable Difficulty moving from lying on back to sitting on the side of the bed? : Unable Difficulty sitting down on and standing up from a chair with arms (e.g., wheelchair, bedside commode, etc,.)?: Unable Help needed moving to and from a bed to chair (including a wheelchair)?: A Lot Help needed walking in hospital room?: A Little Help needed climbing 3-5 steps with a railing? : Total 6 Click Score: 9    End of Session Equipment Utilized During Treatment: Gait belt;Right knee immobilizer;Left knee immobilizer Activity Tolerance: Patient tolerated treatment well Patient left: in chair;with call bell/phone within reach Nurse Communication: Mobility status PT Visit Diagnosis: Difficulty in walking, not elsewhere classified (R26.2)     Time: 6967-8938 PT Time Calculation (min) (ACUTE ONLY): 22 min  Charges:  $Gait Training: 8-22 mins $Therapeutic Exercise: 8-22 mins $Therapeutic Activity: 8-22 mins                    G Codes:       Pg [phone]    Finas Delone 11/15/2017, 12:24 PM

## 2017-11-15 NOTE — Progress Notes (Signed)
Alicia Mcbride  MRN: [id number] DOB/Age: 03/24/1951 67 y.o. [name] Procedure: Procedure(s) (LRB): TOTAL KNEE BILATERAL (Bilateral)     Subjective: Seen up ambulating in hall, made it a few feet before becoming lightheaded. Pain as expected.   Vital Signs Temp:  [98.5 F (36.9 C)-98.7 F (37.1 C)] 98.7 F (37.1 C) (01/06 0550) Pulse Rate:  [97-105] 97 (01/06 0550) Resp:  [14-18] 14 (01/06 0550) BP: (111-121)/(66-88) 121/68 (01/06 0550) SpO2:  [93 %-95 %] 94 % (01/06 0550)  Lab Results Recent Labs    11/13/17 0539 11/14/17 0551  WBC 14.6* 13.5*  HGB 10.0* 10.3*  HCT 29.5* 31.3*  PLT 246 315   BMET Recent Labs    11/13/17 0539  NA 137  K 3.6  CL 104  CO2 27  GLUCOSE 114*  BUN 12  CREATININE 0.58  CALCIUM 8.2*   INR  Date Value Ref Range Status  11/15/2017 1.13  Final     Exam Bilateral knees with scant old bloody drainage. There is some diffuse mild bruising and erythema and expected swelling but knees look benign.  NVI        Plan Continue to mobilize with therapy Lovenox discontinued and xarelto started today. Hopeful for REHAB/CIR stay and transfer tom  [facility] PA-C  11/15/2017, 8:54 AM Contact # [phone]

## 2017-11-15 NOTE — Progress Notes (Addendum)
Occupational Therapy Treatment Patient Details Name: RUBEE VEGA MRN: [id number] DOB: [birth year] Today's Date: 11/15/2017    History of present illness Pt s/p bil TKR    OT comments  Educated pt further on AE options for LB self care. Pt practiced with reacher to doff L sock at [place]. Demonstrated other AE pieces also. Pt starting to feel a little light headed so assisted pt to recline in chair again. Will continue to follow.   Follow Up Recommendations  CIR    Equipment Recommendations  None recommended by OT    Recommendations for Other Services      Precautions / Restrictions Precautions Precautions: Knee Precaution Comments: Reviewed precaution to have no pillows under knees.  Required Braces or Orthoses: Knee Immobilizer - Right;Knee Immobilizer - Left Knee Immobilizer - Right: Discontinue once straight leg raise with < 10 degree lag Knee Immobilizer - Left: Discontinue once straight leg raise with < 10 degree lag Restrictions Weight Bearing Restrictions: No Other Position/Activity Restrictions: WBAT       Mobility Bed Mobility                  Transfers                      Balance                                           ADL either performed or assessed with clinical judgement   ADL                       Lower Body Dressing: Sitting/lateral leans;Minimal assistance Lower Body Dressing Details (indicate cue type and reason): Practiced with reacher at [facility] to doff L sock. Reviewed all pieces of AE and how to use each. Pt states she may obtain AE if needed.  Pt started to feel alittle lightheaded so assisted pt back into chair and raised LEs on footrest.                General ADL Comments: Pt is very motivated. Explained AE kit and all AE items. She practiced with reacher to doff L sock. Demonstrated all other AE pieces to pt.      Vision       Perception     Praxis      Cognition                                                Exercises     Shoulder Instructions       General Comments      Pertinent Vitals/ Pain       Pain Assessment: 0-10 Pain Score: 3  Pain Location: bilateral knee Pain Descriptors / Indicators: Aching Pain Intervention(s): Monitored during session  Home Living                                          Prior Functioning/Environment              Frequency  Min 2X/week        Progress Toward Goals  OT Goals(current goals can now  be found in the care plan section)  Progress towards OT goals: Progressing toward goals     Plan Discharge plan remains appropriate    Co-evaluation                 AM-PAC PT "6 Clicks" Daily Activity     Outcome Measure   Help from another person eating meals?: None Help from another person taking care of personal grooming?: None Help from another person toileting, which includes using toliet, bedpan, or urinal?: Total Help from another person bathing (including washing, rinsing, drying)?: A Lot Help from another person to put on and taking off regular upper body clothing?: A Little Help from another person to put on and taking off regular lower body clothing?: Total 6 Click Score: 15    End of Session Equipment Utilized During Treatment: Other (comment)(AE)  OT Visit Diagnosis: Unsteadiness on feet (R26.81);Pain Pain - Right/Left: Left Pain - part of body: Knee   Activity Tolerance Patient tolerated treatment well   Patient Left in chair   Nurse Communication          Time: 0479-9872 OT Time Calculation (min): 13 min  Charges: OT General Charges $OT Visit: 1 Visit OT Treatments $Self Care/Home Management : 8-22 mins     Jae [place] Lohr 11/15/2017, 11:53 AM

## 2017-11-16 DIAGNOSIS — Z96653 Presence of artificial knee joint, bilateral: Secondary | ICD-10-CM | POA: Diagnosis not present

## 2017-11-16 DIAGNOSIS — E785 Hyperlipidemia, unspecified: Secondary | ICD-10-CM | POA: Diagnosis not present

## 2017-11-16 DIAGNOSIS — M6281 Muscle weakness (generalized): Secondary | ICD-10-CM | POA: Diagnosis not present

## 2017-11-16 DIAGNOSIS — R2689 Other abnormalities of gait and mobility: Secondary | ICD-10-CM | POA: Diagnosis not present

## 2017-11-16 DIAGNOSIS — M170 Bilateral primary osteoarthritis of knee: Secondary | ICD-10-CM | POA: Diagnosis not present

## 2017-11-16 DIAGNOSIS — M25569 Pain in unspecified knee: Secondary | ICD-10-CM | POA: Diagnosis not present

## 2017-11-16 DIAGNOSIS — M25561 Pain in right knee: Secondary | ICD-10-CM | POA: Diagnosis not present

## 2017-11-16 DIAGNOSIS — G8911 Acute pain due to trauma: Secondary | ICD-10-CM | POA: Diagnosis not present

## 2017-11-16 DIAGNOSIS — M545 Low back pain: Secondary | ICD-10-CM | POA: Diagnosis not present

## 2017-11-16 DIAGNOSIS — M25562 Pain in left knee: Secondary | ICD-10-CM | POA: Diagnosis not present

## 2017-11-16 DIAGNOSIS — I10 Essential (primary) hypertension: Secondary | ICD-10-CM | POA: Diagnosis not present

## 2017-11-16 LAB — PROTIME-INR
INR: 1.31
Prothrombin Time: 16.2 seconds — ABNORMAL HIGH (ref 11.4–15.2)

## 2017-11-16 MED ORDER — POLYETHYLENE GLYCOL 3350 17 G PO PACK
17.0000 g | PACK | Freq: Every day | ORAL | 0 refills | Status: DC | PRN
Start: 2017-11-16 — End: 2018-01-04

## 2017-11-16 MED ORDER — ACETAMINOPHEN 325 MG PO TABS: 650.0000 mg | ORAL_TABLET | ORAL | 0 refills | Status: DC | PRN

## 2017-11-16 MED ORDER — TRAMADOL HCL 50 MG PO TABS: 50.0000 mg | ORAL_TABLET | Freq: Four times a day (QID) | ORAL | 0 refills | Status: DC | PRN

## 2017-11-16 MED ORDER — NALOXEGOL OXALATE 25 MG PO TABS: 25.0000 mg | ORAL_TABLET | Freq: Every day | ORAL | 1 refills | Status: DC

## 2017-11-16 MED ORDER — OXYCODONE HCL 10 MG PO TABS: 10.0000 mg | ORAL_TABLET | ORAL | 0 refills | Status: DC | PRN

## 2017-11-16 MED ORDER — METHOCARBAMOL 500 MG PO TABS: 500.0000 mg | ORAL_TABLET | Freq: Four times a day (QID) | ORAL | 0 refills | Status: DC | PRN

## 2017-11-16 MED ORDER — DOCUSATE SODIUM 100 MG PO CAPS: 100.0000 mg | ORAL_CAPSULE | Freq: Two times a day (BID) | ORAL | 0 refills | Status: DC

## 2017-11-16 MED ORDER — FLEET ENEMA 7-19 GM/118ML RE ENEM: 1.0000 | ENEMA | Freq: Once | RECTAL | 0 refills | Status: DC | PRN

## 2017-11-16 MED ORDER — BISACODYL 10 MG RE SUPP: 10.0000 mg | Freq: Every day | RECTAL | 0 refills | Status: DC | PRN

## 2017-11-16 MED ORDER — RIVAROXABAN 10 MG PO TABS: 10.0000 mg | ORAL_TABLET | Freq: Every day | ORAL | 0 refills | Status: DC

## 2017-11-16 NOTE — NC FL2 (Signed)
[place] LEVEL OF CARE SCREENING TOOL     IDENTIFICATION  Patient Name: Alicia Mcbride Birthdate: [birth year] Sex: female Admission Date (Current Location): 11/11/2017  [facility] and [place] Number:  Herbalist and Address:  [facility],  [place] [address], [place]      Provider Number: [id number]  Attending Physician Name and Address:  Gaynelle Arabian, MD  Relative Name and Phone Number:       Current Level of Care: Hospital Recommended Level of Care: [place] Prior Approval Number:    Date Approved/Denied:   PASRR Number:   [id number]  Discharge Plan: SNF    Current Diagnoses: Patient Active Problem List   Diagnosis Date Noted  . OA (osteoarthritis) of knee 04/29/2013  . LOW BACK PAIN 11/21/2010  . VIRAL URI 01/02/2010  . EXTERNAL HEMORRHOIDS 01/20/2008  . Hyperlipemia, mixed 07/23/2007  . Essential hypertension 07/23/2007    Orientation RESPIRATION BLADDER Height & Weight     Self, Time, Situation, Place  Normal Continent Weight: 194 lb (88 kg) Height:  5' 7.5" (171.5 cm)  BEHAVIORAL SYMPTOMS/MOOD NEUROLOGICAL BOWEL NUTRITION STATUS      Continent Diet(Low Sodium Heart Healthy )  AMBULATORY STATUS COMMUNICATION OF NEEDS Skin   Extensive Assist Verbally Other (Comment)(Incision)                       Personal Care Assistance Level of Assistance  Bathing, Feeding, Dressing Bathing Assistance: Limited assistance Feeding assistance: Independent Dressing Assistance: Limited assistance     Functional Limitations Info  Sight, Hearing, Speech Sight Info: Adequate Hearing Info: Adequate Speech Info: Adequate    SPECIAL CARE FACTORS FREQUENCY  PT (By licensed PT), OT (By licensed OT)     PT Frequency: 5x/week OT Frequency: 5x/week            Contractures Contractures Info: Not present    Additional Factors Info  Code Status, Allergies, Psychotropic Code Status Info:  Fullcode Allergies Info: Allergies: No Known Allergies           Current Medications (11/16/2017):  This is the current hospital active medication list Current Facility-Administered Medications  Medication Dose Route Frequency Provider Last Rate Last Dose  . 0.9 %  sodium chloride infusion   Intravenous Continuous Gaynelle Arabian, MD 75 mL/hr at 11/13/17 0309    . acetaminophen (TYLENOL) tablet 650 mg  650 mg Oral Q4H PRN Gaynelle Arabian, MD       Or  . acetaminophen (TYLENOL) suppository 650 mg  650 mg Rectal Q4H PRN Aluisio, Pilar Plate, MD      . bisacodyl (DULCOLAX) suppository 10 mg  10 mg Rectal Daily PRN Gaynelle Arabian, MD      . diphenhydrAMINE (BENADRYL) 12.5 MG/5ML elixir 12.5-25 mg  12.5-25 mg Oral Q4H PRN Aluisio, Pilar Plate, MD      . docusate sodium (COLACE) capsule 100 mg  100 mg Oral BID Gaynelle Arabian, MD   100 mg at 11/16/17 0842  . losartan (COZAAR) tablet 100 mg  100 mg Oral Daily Gaynelle Arabian, MD   100 mg at 11/16/17 4782   And  . hydrochlorothiazide (HYDRODIURIL) tablet 25 mg  25 mg Oral Daily Gaynelle Arabian, MD   25 mg at 11/16/17 0842  . menthol-cetylpyridinium (CEPACOL) lozenge 3 mg  1 lozenge Oral PRN Aluisio, Pilar Plate, MD       Or  . phenol (CHLORASEPTIC) mouth spray 1 spray  1 spray Mouth/Throat PRN Gaynelle Arabian, MD      .  methocarbamol (ROBAXIN) tablet 500 mg  500 mg Oral Q6H PRN Gaynelle Arabian, MD   500 mg at 11/15/17 2358   Or  . methocarbamol (ROBAXIN) 500 mg in dextrose 5 % 50 mL IVPB  500 mg Intravenous Q6H PRN Aluisio, Pilar Plate, MD      . metoCLOPramide (REGLAN) tablet 5-10 mg  5-10 mg Oral Q8H PRN Aluisio, Pilar Plate, MD       Or  . metoCLOPramide (REGLAN) injection 5-10 mg  5-10 mg Intravenous Q8H PRN Aluisio, Pilar Plate, MD      . morphine 4 MG/ML injection 1 mg  1 mg Intravenous Q2H PRN Gaynelle Arabian, MD   1 mg at 11/13/17 0923  . naloxegol oxalate (MOVANTIK) tablet 25 mg  25 mg Oral Daily Gaynelle Arabian, MD   25 mg at 11/16/17 0846  . ondansetron (ZOFRAN) tablet 4 mg  4  mg Oral Q6H PRN Gaynelle Arabian, MD   4 mg at 11/15/17 0806   Or  . ondansetron (ZOFRAN) injection 4 mg  4 mg Intravenous Q6H PRN Gaynelle Arabian, MD   4 mg at 11/13/17 1027  . oxyCODONE (Oxy IR/ROXICODONE) immediate release tablet 10-20 mg  10-20 mg Oral Q3H PRN Perkins, Alexzandrew L, PA-C   10 mg at 11/16/17 0421  . polyethylene glycol (MIRALAX / GLYCOLAX) packet 17 g  17 g Oral Daily PRN Aluisio, Pilar Plate, MD      . rivaroxaban Alveda Reasons) tablet 10 mg  10 mg Oral Daily Polly Cobia, RPH   10 mg at 11/16/17 0843  . sodium phosphate (FLEET) 7-19 GM/118ML enema 1 enema  1 enema Rectal Once PRN Aluisio, Pilar Plate, MD      . traMADol Veatrice Bourbon) tablet 50-100 mg  50-100 mg Oral Q6H PRN Perkins, Alexzandrew L, PA-C         Discharge Medications: Please see discharge summary for a list of discharge medications.  Relevant Imaging Results:  Relevant Lab Results:   Additional Information ssn:[id number]  Lia Hopping, LCSW

## 2017-11-16 NOTE — Discharge Summary (Signed)
Physician Discharge Summary   Patient ID: Alicia Mcbride MRN: [id number] DOB/AGE: [birth year] 67 y.o.  Admit date: 11/11/2017 Discharge date: 11-16-2016  Primary Diagnosis:  Osteoarthritis Bilateral knee(s)   Admission Diagnoses:  Past Medical History:  Diagnosis Date  . Allergy   . Gynecological examination    sees Dr. Alden Hipp  . Hypertension   . PONV (postoperative nausea and vomiting)    Discharge Diagnoses:   Principal Problem:   OA (osteoarthritis) of knee  Estimated body mass index is 29.94 kg/m as calculated from the following:   Height as of this encounter: 5' 7.5" (1.715 m).   Weight as of this encounter: 88 kg (194 lb).  Procedure:  Procedure(s) (LRB): TOTAL KNEE BILATERAL (Bilateral)   Consults: [facility]  HPI: Alicia Mcbride is a 67 y.o. year old female with end stage OA of both knees with progressively worsening pain and dysfunction. The patient has constant pain, with activity and at rest and significant functional deficits with difficulties even with ADLs. The patient has had extensive non-op management including analgesics, injections of cortisone and viscosupplements, and home exercise program, but remains in significant pain with significant dysfunction. We discussed replacing both knees in the same setting versus one at a time including procedure, risks, potential complications, rehab course, and pros and cons associated with each and the patient elects to do both knees at the same time. The patient presents now for bilateral Total Knee Arthroplasty.    Laboratory Data: Admission on 11/11/2017  Component Date Value Ref Range Status  . WBC 11/12/2017 13.8* 4.0 - 10.5 K/uL Final  . RBC 11/12/2017 3.68* 3.87 - 5.11 MIL/uL Final  . Hemoglobin 11/12/2017 10.9* 12.0 - 15.0 g/dL Final  . HCT 11/12/2017 32.5* 36.0 - 46.0 % Final  . MCV 11/12/2017 88.3  78.0 - 100.0 fL Final  . MCH 11/12/2017 29.6  26.0 - 34.0 pg Final  . MCHC 11/12/2017 33.5   30.0 - 36.0 g/dL Final  . RDW 11/12/2017 13.2  11.5 - 15.5 % Final  . Platelets 11/12/2017 279  150 - 400 K/uL Final  . Sodium 11/12/2017 137  135 - 145 mmol/L Final  . Potassium 11/12/2017 4.3  3.5 - 5.1 mmol/L Final  . Chloride 11/12/2017 104  101 - 111 mmol/L Final  . CO2 11/12/2017 25  22 - 32 mmol/L Final  . Glucose, Bld 11/12/2017 142* 65 - 99 mg/dL Final  . BUN 11/12/2017 14  6 - 20 mg/dL Final  . Creatinine, Ser 11/12/2017 0.52  0.44 - 1.00 mg/dL Final  . Calcium 11/12/2017 8.2* 8.9 - 10.3 mg/dL Final  . GFR calc non Af Amer 11/12/2017 >60  >60 mL/min Final  . GFR calc Af Amer 11/12/2017 >60  >60 mL/min Final   Comment: (NOTE) The eGFR has been calculated using the CKD EPI equation. This calculation has not been validated in all clinical situations. eGFR's persistently <60 mL/min signify possible Chronic Kidney Disease.   . Anion gap 11/12/2017 8  5 - 15 Final  . Prothrombin Time 11/12/2017 15.5* 11.4 - 15.2 seconds Final  . INR 11/12/2017 1.24   Final  . WBC 11/13/2017 14.6* 4.0 - 10.5 K/uL Final  . RBC 11/13/2017 3.32* 3.87 - 5.11 MIL/uL Final  . Hemoglobin 11/13/2017 10.0* 12.0 - 15.0 g/dL Final  . HCT 11/13/2017 29.5* 36.0 - 46.0 % Final  . MCV 11/13/2017 88.9  78.0 - 100.0 fL Final  . MCH 11/13/2017 30.1  26.0 - 34.0 pg  Final  . MCHC 11/13/2017 33.9  30.0 - 36.0 g/dL Final  . RDW 11/13/2017 13.3  11.5 - 15.5 % Final  . Platelets 11/13/2017 246  150 - 400 K/uL Final  . Sodium 11/13/2017 137  135 - 145 mmol/L Final  . Potassium 11/13/2017 3.6  3.5 - 5.1 mmol/L Final  . Chloride 11/13/2017 104  101 - 111 mmol/L Final  . CO2 11/13/2017 27  22 - 32 mmol/L Final  . Glucose, Bld 11/13/2017 114* 65 - 99 mg/dL Final  . BUN 11/13/2017 12  6 - 20 mg/dL Final  . Creatinine, Ser 11/13/2017 0.58  0.44 - 1.00 mg/dL Final  . Calcium 11/13/2017 8.2* 8.9 - 10.3 mg/dL Final  . GFR calc non Af Amer 11/13/2017 >60  >60 mL/min Final  . GFR calc Af Amer 11/13/2017 >60  >60 mL/min Final     Comment: (NOTE) The eGFR has been calculated using the CKD EPI equation. This calculation has not been validated in all clinical situations. eGFR's persistently <60 mL/min signify possible Chronic Kidney Disease.   . Anion gap 11/13/2017 6  5 - 15 Final  . Prothrombin Time 11/13/2017 18.2* 11.4 - 15.2 seconds Final  . INR 11/13/2017 1.52   Final  . WBC 11/14/2017 13.5* 4.0 - 10.5 K/uL Final  . RBC 11/14/2017 3.55* 3.87 - 5.11 MIL/uL Final  . Hemoglobin 11/14/2017 10.3* 12.0 - 15.0 g/dL Final  . HCT 11/14/2017 31.3* 36.0 - 46.0 % Final  . MCV 11/14/2017 88.2  78.0 - 100.0 fL Final  . MCH 11/14/2017 29.0  26.0 - 34.0 pg Final  . MCHC 11/14/2017 32.9  30.0 - 36.0 g/dL Final  . RDW 11/14/2017 12.9  11.5 - 15.5 % Final  . Platelets 11/14/2017 315  150 - 400 K/uL Final  . Prothrombin Time 11/14/2017 16.1* 11.4 - 15.2 seconds Final  . INR 11/14/2017 1.30   Final  . Prothrombin Time 11/15/2017 14.4  11.4 - 15.2 seconds Final  . INR 11/15/2017 1.13   Final  . Prothrombin Time 11/16/2017 16.2* 11.4 - 15.2 seconds Final  . INR 11/16/2017 1.31   Final  . Color, Urine 11/15/2017 YELLOW  YELLOW Final  . APPearance 11/15/2017 CLEAR  CLEAR Final  . Specific Gravity, Urine 11/15/2017 1.005  1.005 - 1.030 Final  . pH 11/15/2017 7.0  5.0 - 8.0 Final  . Glucose, UA 11/15/2017 NEGATIVE  NEGATIVE mg/dL Final  . Hgb urine dipstick 11/15/2017 MODERATE* NEGATIVE Final  . Bilirubin Urine 11/15/2017 NEGATIVE  NEGATIVE Final  . Ketones, ur 11/15/2017 NEGATIVE  NEGATIVE mg/dL Final  . Protein, ur 11/15/2017 NEGATIVE  NEGATIVE mg/dL Final  . Nitrite 11/15/2017 NEGATIVE  NEGATIVE Final  . Leukocytes, UA 11/15/2017 MODERATE* NEGATIVE Final  . RBC / HPF 11/15/2017 6-30  0 - 5 RBC/hpf Final  . WBC, UA 11/15/2017 6-30  0 - 5 WBC/hpf Final  . Bacteria, UA 11/15/2017 RARE* NONE SEEN Final  . Squamous Epithelial / LPF 11/15/2017 0-5* NONE SEEN Final  Hospital Outpatient Visit on 11/04/2017  Component Date Value  Ref Range Status  . aPTT 11/04/2017 26  24 - 36 seconds Final  . Sodium 11/04/2017 139  135 - 145 mmol/L Final  . Potassium 11/04/2017 3.7  3.5 - 5.1 mmol/L Final  . Chloride 11/04/2017 100* 101 - 111 mmol/L Final  . CO2 11/04/2017 31  22 - 32 mmol/L Final  . Glucose, Bld 11/04/2017 102* 65 - 99 mg/dL Final  . BUN 11/04/2017 16  6 -  20 mg/dL Final  . Creatinine, Ser 11/04/2017 0.61  0.44 - 1.00 mg/dL Final  . Calcium 11/04/2017 9.0  8.9 - 10.3 mg/dL Final  . Total Protein 11/04/2017 7.1  6.5 - 8.1 g/dL Final  . Albumin 11/04/2017 4.2  3.5 - 5.0 g/dL Final  . AST 11/04/2017 17  15 - 41 U/L Final  . ALT 11/04/2017 16  14 - 54 U/L Final  . Alkaline Phosphatase 11/04/2017 83  38 - 126 U/L Final  . Total Bilirubin 11/04/2017 0.6  0.3 - 1.2 mg/dL Final  . GFR calc non Af Amer 11/04/2017 >60  >60 mL/min Final  . GFR calc Af Amer 11/04/2017 >60  >60 mL/min Final   Comment: (NOTE) The eGFR has been calculated using the CKD EPI equation. This calculation has not been validated in all clinical situations. eGFR's persistently <60 mL/min signify possible Chronic Kidney Disease.   . Anion gap 11/04/2017 8  5 - 15 Final  . Prothrombin Time 11/04/2017 14.3  11.4 - 15.2 seconds Final  . INR 11/04/2017 1.12   Final  . ABO/RH(D) 11/04/2017 B POS   Final  . Antibody Screen 11/04/2017 NEG   Final  . Sample Expiration 11/04/2017 11/14/2017   Final  . Extend sample reason 11/04/2017 NO TRANSFUSIONS OR PREGNANCY IN THE PAST 3 MONTHS   Final  . MRSA, PCR 11/04/2017 NEGATIVE  NEGATIVE Final  . Staphylococcus aureus 11/04/2017 NEGATIVE  NEGATIVE Final   Comment: (NOTE) The Xpert SA Assay (FDA approved for NASAL specimens in patients 54 years of age and older), is one component of a comprehensive surveillance program. It is not intended to diagnose infection nor to guide or monitor treatment.   . ABO/RH(D) 11/04/2017 B POS   Final  Office Visit on 10/27/2017  Component Date Value Ref Range Status  .  Cholesterol 10/27/2017 240* 0 - 200 mg/dL Final   ATP III Classification       Desirable:  < 200 mg/dL               Borderline High:  200 - 239 mg/dL          High:  > = 240 mg/dL  . Triglycerides 10/27/2017 116.0  0.0 - 149.0 mg/dL Final   Normal:  <150 mg/dLBorderline High:  150 - 199 mg/dL  . HDL 10/27/2017 71.00  >39.00 mg/dL Final  . VLDL 10/27/2017 23.2  0.0 - 40.0 mg/dL Final  . LDL Cholesterol 10/27/2017 146* 0 - 99 mg/dL Final  . Total CHOL/HDL Ratio 10/27/2017 3   Final                  Men          Women1/2 Average Risk     3.4          3.3Average Risk          5.0          4.42X Average Risk          9.6          7.13X Average Risk          15.0          11.0                      . NonHDL 10/27/2017 168.92   Final   NOTE:  Non-HDL goal should be 30 mg/dL higher than patient's LDL goal (i.e. LDL goal of < 70 mg/dL, would  have non-HDL goal of < 100 mg/dL)  . Sodium 10/27/2017 140  135 - 145 mEq/L Final  . Potassium 10/27/2017 3.7  3.5 - 5.1 mEq/L Final  . Chloride 10/27/2017 99  96 - 112 mEq/L Final  . CO2 10/27/2017 29  19 - 32 mEq/L Final  . Glucose, Bld 10/27/2017 78  70 - 99 mg/dL Final  . BUN 10/27/2017 18  6 - 23 mg/dL Final  . Creatinine, Ser 10/27/2017 0.59  0.40 - 1.20 mg/dL Final  . Calcium 10/27/2017 9.6  8.4 - 10.5 mg/dL Final  . GFR 10/27/2017 108.71  >60.00 mL/min Final  . Total Bilirubin 10/27/2017 1.0  0.2 - 1.2 mg/dL Final  . Bilirubin, Direct 10/27/2017 0.1  0.0 - 0.3 mg/dL Final  . Alkaline Phosphatase 10/27/2017 84  39 - 117 U/L Final  . AST 10/27/2017 13  0 - 37 U/L Final  . ALT 10/27/2017 13  0 - 35 U/L Final  . Total Protein 10/27/2017 7.2  6.0 - 8.3 g/dL Final  . Albumin 10/27/2017 4.5  3.5 - 5.2 g/dL Final  . TSH 10/27/2017 0.39  0.35 - 4.50 uIU/mL Final  . Color, UA 10/27/2017 yellow   Final  . Clarity, UA 10/27/2017 clear   Final  . Glucose, UA 10/27/2017 N   Final  . Bilirubin, UA 10/27/2017 N   Final  . Ketones, UA 10/27/2017 N   Final  . Spec  Grav, UA 10/27/2017 >=1.030* 1.010 - 1.025 Final  . Blood, UA 10/27/2017 2+   Final  . pH, UA 10/27/2017 6.0  5.0 - 8.0 Final  . Protein, UA 10/27/2017 N   Final  . Urobilinogen, UA 10/27/2017 0.2  0.2 or 1.0 E.U./dL Final  . Nitrite, UA 10/27/2017 N   Final  . Leukocytes, UA 10/27/2017 Negative  Negative Final  . WBC 10/27/2017 8.4  4.0 - 10.5 K/uL Final  . RBC 10/27/2017 4.97  3.87 - 5.11 Mil/uL Final  . Hemoglobin 10/27/2017 15.0  12.0 - 15.0 g/dL Final  . HCT 10/27/2017 44.6  36.0 - 46.0 % Final  . MCV 10/27/2017 89.8  78.0 - 100.0 fl Final  . MCHC 10/27/2017 33.7  30.0 - 36.0 g/dL Final  . RDW 10/27/2017 13.5  11.5 - 15.5 % Final  . Platelets 10/27/2017 343.0  150.0 - 400.0 K/uL Final  . Neutrophils Relative % 10/27/2017 67.6  43.0 - 77.0 % Final  . Lymphocytes Relative 10/27/2017 23.5  12.0 - 46.0 % Final  . Monocytes Relative 10/27/2017 6.4  3.0 - 12.0 % Final  . Eosinophils Relative 10/27/2017 1.6  0.0 - 5.0 % Final  . Basophils Relative 10/27/2017 0.9  0.0 - 3.0 % Final  . Neutro Abs 10/27/2017 5.7  1.4 - 7.7 K/uL Final  . Lymphs Abs 10/27/2017 2.0  0.7 - 4.0 K/uL Final  . Monocytes Absolute 10/27/2017 0.5  0.1 - 1.0 K/uL Final  . Eosinophils Absolute 10/27/2017 0.1  0.0 - 0.7 K/uL Final  . Basophils Absolute 10/27/2017 0.1  0.0 - 0.1 K/uL Final     X-Rays:No results found.  EKG: Orders placed or performed in visit on 10/27/17  . EKG 12-Lead     Hospital Course: Patient was admitted to [facility] and taken to the OR and underwent the above stated procedure well without complications.  Patient tolerated the procedure well and was later transferred to the recovery room and then to the orthopaedic floor for postoperative care. Anesthesia was consulted postoperatively to place  an epidural in for postoperative pain management. The patient was also given PO and IV analgesics for pain control following their surgery.  They were given 24 hours of postoperative antibiotics  and started on DVT prophylaxis in the form of Coumadin after the epidural had been removed.   PT and OT were ordered for total joint protocol.  Discharge planning consulted to help with postop disposition and equipment needs.  Patient had a tough night on the evening of surgery and started to get up OOB with therapy on day one. Hemovac drains were pulled without difficulty on day one.  Continued to work with therapy into day two.  Dressings were changed on day two and both incisions were healing well. DC'd Warfarin at that time. Started Lovenox 30 BID for coverage while in house, and then converted over to [place] prior to discharge. The epidural was removed without difficulty by Anesthesia on day two.  By day three, the patient started to show progress with therapy.  They continued to receive therapy each day for continued total knee protocol.  The incisions were healing well.  They continued to progress on day four.  Lovenox was DC'd and converted to Xarelto on POD 4. Patient was seen on day five by Dr. Wynelle Link  at which time the patient was seen in rounds and was ready to go home.   Diet: Cardiac diet Activity:WBAT Follow-up:in 2 weeks following surgery Disposition - Rehab Discharged Condition: stable   Discharge Instructions    Call MD / Call 911   Complete by:  As directed    If you experience chest pain or shortness of breath, CALL 911 and be transported to the hospital emergency room.  If you develope a fever above 101 F, pus (white drainage) or increased drainage or redness at the wound, or calf pain, call your surgeon's office.   Change dressing   Complete by:  As directed    Change dressing daily with sterile 4 x 4 inch gauze dressing and apply TED hose. Do not submerge the incision under water.   Constipation Prevention   Complete by:  As directed    Drink plenty of fluids.  Prune juice may be helpful.  You may use a stool softener, such as Colace (over the counter) 100 mg twice a  day.  Use MiraLax (over the counter) for constipation as needed.   Diet - low sodium heart healthy   Complete by:  As directed    Discharge instructions   Complete by:  As directed    Take Xarelto daily for four weeks. Once the patient has completed the blood thinner regimen, then take a Baby 81 mg Aspirin daily for three more weeks.  Pick up stool softner and laxative for home use following surgery while on pain medications. Do not submerge incision under water. Please use good hand washing techniques while changing dressing each day. May shower starting three days after surgery. Please use a clean towel to pat the incision dry following showers. Continue to use ice for pain and swelling after surgery. Do not use any lotions or creams on the incision until instructed by your surgeon.  Wear both TED hose on both legs during the day every day for three weeks, but may remove the TED hose at night at home.  Postoperative Constipation Protocol  Constipation - defined medically as fewer than three stools per week and severe constipation as less than one stool per week.  One of the most common issues  patients have following surgery is constipation.  Even if you have a regular bowel pattern at home, your normal regimen is likely to be disrupted due to multiple reasons following surgery.  Combination of anesthesia, postoperative narcotics, change in appetite and fluid intake all can affect your bowels.  In order to avoid complications following surgery, here are some recommendations in order to help you during your recovery period.  Colace (docusate) - Pick up an over-the-counter form of Colace or another stool softener and take twice a day as long as you are requiring postoperative pain medications.  Take with a full glass of water daily.  If you experience loose stools or diarrhea, hold the colace until you stool forms back up.  If your symptoms do not get better within 1 week or if they get worse,  check with your doctor.  Dulcolax (bisacodyl) - Pick up over-the-counter and take as directed by the product packaging as needed to assist with the movement of your bowels.  Take with a full glass of water.  Use this product as needed if not relieved by Colace only.   MiraLax (polyethylene glycol) - Pick up over-the-counter to have on hand.  MiraLax is a solution that will increase the amount of water in your bowels to assist with bowel movements.  Take as directed and can mix with a glass of water, juice, soda, coffee, or tea.  Take if you go more than two days without a movement. Do not use MiraLax more than once per day. Call your doctor if you are still constipated or irregular after using this medication for 7 days in a row.  If you continue to have problems with postoperative constipation, please contact the office for further assistance and recommendations.  If you experience "the worst abdominal pain ever" or develop nausea or vomiting, please contact the office immediatly for further recommendations for treatment.   Do not put a pillow under the knee. Place it under the heel.   Complete by:  As directed    Do not sit on low chairs, stoools or toilet seats, as it may be difficult to get up from low surfaces   Complete by:  As directed    Driving restrictions   Complete by:  As directed    No driving until released by the physician.   Increase activity slowly as tolerated   Complete by:  As directed    Lifting restrictions   Complete by:  As directed    No lifting until released by the physician.   Patient may shower   Complete by:  As directed    You may shower without a dressing once there is no drainage.  Do not wash over the wound.  If drainage remains, do not shower until drainage stops.   TED hose   Complete by:  As directed    Use stockings (TED hose) for 3 weeks on both leg(s).  You may remove them at night for sleeping.   Weight bearing as tolerated   Complete by:  As  directed    Laterality:  bilateral   Extremity:  Lower     Allergies as of 11/16/2017   No Known Allergies     Medication List    STOP taking these medications   IBUPROFEN PM 200-25 MG Caps Generic drug:  Ibuprofen-Diphenhydramine HCl   UNABLE TO FIND     TAKE these medications   acetaminophen 325 MG tablet Commonly known as:  TYLENOL Take 2 tablets (  650 mg total) by mouth every 4 (four) hours as needed for mild pain ((score 1 to 3) or temp > 100.5).   bisacodyl 10 MG suppository Commonly known as:  DULCOLAX Place 1 suppository (10 mg total) rectally daily as needed for moderate constipation.   docusate sodium 100 MG capsule Commonly known as:  COLACE Take 1 capsule (100 mg total) by mouth 2 (two) times daily.   losartan-hydrochlorothiazide 100-25 MG tablet Commonly known as:  HYZAAR Take 1 tablet by mouth daily.   methocarbamol 500 MG tablet Commonly known as:  ROBAXIN Take 1 tablet (500 mg total) by mouth every 6 (six) hours as needed for muscle spasms.   naloxegol oxalate 25 MG Tabs tablet Commonly known as:  MOVANTIK Take 1 tablet (25 mg total) by mouth daily.   Oxycodone HCl 10 MG Tabs Take 1-2 tablets (10-20 mg total) by mouth every 4 (four) hours as needed for moderate pain or severe pain.   polyethylene glycol packet Commonly known as:  MIRALAX / GLYCOLAX Take 17 g by mouth daily as needed for mild constipation.   rivaroxaban 10 MG Tabs tablet Commonly known as:  XARELTO Take 1 tablet (10 mg total) by mouth daily. Take Xarelto daily for four weeks. Once the patient has completed the blood thinner regimen, then take a Baby 81 mg Aspirin daily for three more weeks.   sodium phosphate 7-19 GM/118ML Enem Place 133 mLs (1 enema total) rectally once as needed for severe constipation.   traMADol 50 MG tablet Commonly known as:  ULTRAM Take 1-2 tablets (50-100 mg total) by mouth every 6 (six) hours as needed (mild pain).            Discharge Care  Instructions  (From admission, onward)        Start     Ordered   11/16/17 0000  Weight bearing as tolerated    Question Answer Comment  Laterality bilateral   Extremity Lower      11/16/17 0656   11/16/17 0000  Change dressing    Comments:  Change dressing daily with sterile 4 x 4 inch gauze dressing and apply TED hose. Do not submerge the incision under water.   11/16/17 0656     Follow-up Information    Gaynelle Arabian, MD. Schedule an appointment as soon as possible for a visit on 11/24/2017.   Specialty:  Orthopedic Surgery Contact information: [address] [place] [address] [phone]           Signed: Arlee Muslim, PA-C Orthopaedic Surgery 11/16/2017, 6:57 AM

## 2017-11-16 NOTE — Progress Notes (Signed)
[facility] arrived to transport patient off unit.

## 2017-11-16 NOTE — Progress Notes (Signed)
Physical Therapy Treatment Patient Details Name: Alicia Mcbride MRN: [id number] DOB: Nov 06, 1951 Today's Date: 11/16/2017    History of Present Illness Pt s/p bil TKR     PT Comments    The patient is slowly progressing. Required L KI today. Consider No KI next visit. Recommend post acute rehab at DC.    Follow Up Recommendations  CIR/SNF     Equipment Recommendations  None recommended by PT    Recommendations for Other Services       Precautions / Restrictions Precautions Precautions: Knee Precaution Comments: Reviewed precaution to have no pillows under knees.  Required Braces or Orthoses: Knee Immobilizer - Left Knee Immobilizer - Left: Discontinue once straight leg raise with < 10 degree lag Restrictions Other Position/Activity Restrictions: WBAT    Mobility  Bed Mobility   Bed Mobility: Supine to Sit     Supine to sit: Min assist     General bed mobility comments: assist with legs to lower to the floor  Transfers Overall transfer level: Needs assistance Equipment used: Rolling walker (2 wheeled) Transfers: Sit to/from Stand Sit to Stand: Mod assist         General transfer comment: cues for sequence, use of UEs to self assist and LE management.  Elevated bed, assist to lean forward. Assist to descend to recliner, walk ;egs forward.  Ambulation/Gait Ambulation/Gait assistance: Min assist;+2 safety/equipment Ambulation Distance (Feet): 55 Feet Assistive device: Rolling walker (2 wheeled) Gait Pattern/deviations: Step-to pattern;Decreased step length - right;Decreased step length - left;Shuffle;Trunk flexed     General Gait Details: cues for sequence, posture and position from RW.  BP 125/75, no dizziness   Stairs            Wheelchair Mobility    Modified Rankin (Stroke Patients Only)       Balance                                            Cognition Arousal/Alertness: Awake/alert                                             Exercises Total Joint Exercises Ankle Circles/Pumps: AROM;Both;20 reps;Supine Quad Sets: AROM;Both;Supine;15 reps Heel Slides: AAROM;Both;Supine;15 reps Hip ABduction/ADduction: AROM;Both;10 reps Straight Leg Raises: Right;Supine;AAROM;AROM;10 reps Goniometric ROM: 10-45 each knee flexion    General Comments        Pertinent Vitals/Pain Pain Score: 4  Pain Location: bilateral knee Pain Descriptors / Indicators: Sore Pain Intervention(s): Premedicated before session    Home Living                      Prior Function            PT Goals (current goals can now be found in the care plan section) Progress towards PT goals: Progressing toward goals    Frequency    7X/week      PT Plan Current plan remains appropriate    Co-evaluation              AM-PAC PT "6 Clicks" Daily Activity  Outcome Measure  Difficulty turning over in bed (including adjusting bedclothes, sheets and blankets)?: Unable Difficulty moving from lying on back to sitting on the side of the bed? : Unable Difficulty  sitting down on and standing up from a chair with arms (e.g., wheelchair, bedside commode, etc,.)?: Unable Help needed moving to and from a bed to chair (including a wheelchair)?: A Lot Help needed walking in hospital room?: A Lot Help needed climbing 3-5 steps with a railing? : Total 6 Click Score: 8    End of Session Equipment Utilized During Treatment: Gait belt;Left knee immobilizer Activity Tolerance: Patient tolerated treatment well;Patient limited by fatigue Patient left: in chair;with call bell/phone within reach Nurse Communication: Mobility status PT Visit Diagnosis: Difficulty in walking, not elsewhere classified (R26.2)     Time: 4081-4481 PT Time Calculation (min) (ACUTE ONLY): 35 min  Charges:  $Gait Training: 8-22 mins $Therapeutic Exercise: 8-22 mins                    G CodesTresa Endo  PT [phone]    [name] 11/16/2017, 2:35 PM

## 2017-11-16 NOTE — Care Management Important Message (Signed)
Important Message  Patient Details  Name: Alicia Mcbride MRN: [id number] Date of Birth: [birth year]   Medicare Important Message Given:  Yes    Kerin Salen 11/16/2017, 10:53 AMImportant Message  Patient Details  Name: Alicia Mcbride MRN: [id number] Date of Birth: December 08, 1950   Medicare Important Message Given:  Yes    Kerin Salen 11/16/2017, 10:52 AM

## 2017-11-16 NOTE — Progress Notes (Signed)
HTA Authorization started.  Alicia Mcbride, Alicia Mcbride, MSW Clinical Social Worker  [phone] 11/16/2017  12:04 PM

## 2017-11-16 NOTE — Progress Notes (Addendum)
[facility] inpatient rehab admissions - I have received a denial for acute inpatient rehab admission from [facility].  Options will be for SNF.  I have contacted the case manager.  I will also let the social worker know of denial.  Call me for questions.  [phone]  I spoke with the patient.  Her first choice for SNF is [facility].  Call me for questions.  [phone]

## 2017-11-16 NOTE — Progress Notes (Signed)
   Subjective: 5 Days Post-Op Procedure(s) (LRB): TOTAL KNEE BILATERAL (Bilateral) Patient reports pain as mild and moderate.   Patient seen in rounds with Dr. Wynelle Link. Patient is well, but has had some minor complaints of pain in the knees, requiring pain medications Patient is ready to go tot he [facility]  Objective: Vital signs in last 24 hours: Temp:  [97.6 F (36.4 C)-98.7 F (37.1 C)] 98.7 F (37.1 C) (01/07 0619) Pulse Rate:  [87-96] 87 (01/07 0619) Resp:  [14-16] 16 (01/07 0619) BP: (104-132)/(56-62) 132/62 (01/07 0619) SpO2:  [94 %-100 %] 96 % (01/07 0619)  Intake/Output from previous day:  Intake/Output Summary (Last 24 hours) at 11/16/2017 0650 Last data filed at 11/16/2017 0619 Gross per 24 hour  Intake 660 ml  Output -  Net 660 ml    Intake/Output this shift: Total I/O In: 300 [P.O.:300] Out: -   Labs: Recent Labs    11/14/17 0551  HGB 10.3*   Recent Labs    11/14/17 0551  WBC 13.5*  RBC 3.55*  HCT 31.3*  PLT 315   No results for input(s): NA, K, CL, CO2, BUN, CREATININE, GLUCOSE, CALCIUM in the last 72 hours. Recent Labs    11/15/17 0543 11/16/17 0549  INR 1.13 1.31    EXAM: General - Patient is Alert and Appropriate Extremity - Neurovascular intact Sensation intact distally Incisions - clean, dry, no drainage Motor Function - intact, moving feet and toes well on exam.   Assessment/Plan: 5 Days Post-Op Procedure(s) (LRB): TOTAL KNEE BILATERAL (Bilateral) Procedure(s) (LRB): TOTAL KNEE BILATERAL (Bilateral) Past Medical History:  Diagnosis Date  . Allergy   . Gynecological examination    sees Dr. Alden Hipp  . Hypertension   . PONV (postoperative nausea and vomiting)    Principal Problem:   OA (osteoarthritis) of knee  Estimated body mass index is 29.94 kg/m as calculated from the following:   Height as of this encounter: 5' 7.5" (1.715 m).   Weight as of this encounter: 88 kg (194 lb). Up with therapy Diet - Cardiac  diet Follow up - in 2 weeks from surgery Activity - WBAT Disposition - Rehab Condition Upon Discharge - Stable D/C Meds - See DC Summary DVT Prophylaxis - Xarelto  Arlee Muslim, PA-C Orthopaedic Surgery 11/16/2017, 6:50 AM

## 2017-11-16 NOTE — Clinical Social Work Placement (Addendum)
Authorization received, [phone] 7 days Patient will transport by [facility]. [facility] arranged for transport/Nurse given number for report.    CLINICAL SOCIAL WORK PLACEMENT  NOTE  Date:  11/16/2017  Patient Details  Name: Alicia Mcbride MRN: [id number] Date of Birth: 1951/02/06  Clinical Social Work is seeking post-discharge placement for this patient at the [place] level of care (*CSW will initial, date and re-position this form in  chart as items are completed):  Yes   Patient/family provided with [facility] Work Department's list of facilities offering this level of care within the geographic area requested by the patient (or if unable, by the patient's family).  Yes   Patient/family informed of their freedom to choose among providers that offer the needed level of care, that participate in Medicare, Medicaid or managed care program needed by the patient, have an available bed and are willing to accept the patient.  Yes   Patient/family informed of [facility]'s ownership interest in [facility] and [facility], as well as of the fact that they are under no obligation to receive care at these facilities.  PASRR submitted to EDS on 11/16/17     PASRR number received on 11/16/17     Existing PASRR number confirmed on       FL2 transmitted to all facilities in geographic area requested by pt/family on       FL2 transmitted to all facilities within larger geographic area on 11/16/17     Patient informed that his/her managed care company has contracts with or will negotiate with certain facilities, including the following:  [facility]     Yes   Patient/family informed of bed offers received.  Patient chooses bed at [facility]     Physician recommends and patient chooses bed at      Patient to be transferred to [facility] on 11/16/17.  Patient to be transferred to facility by [facility]     Patient family notified on 11/16/17 of transfer.  Name  of family member notified:  Patient will notify spouse     PHYSICIAN Please sign FL2     Additional Comment:    _______________________________________________ Lia Hopping, LCSW 11/16/2017, 3:12 PM

## 2017-11-18 DIAGNOSIS — M25562 Pain in left knee: Secondary | ICD-10-CM | POA: Diagnosis not present

## 2017-11-18 DIAGNOSIS — R2689 Other abnormalities of gait and mobility: Secondary | ICD-10-CM | POA: Diagnosis not present

## 2017-11-18 DIAGNOSIS — M25561 Pain in right knee: Secondary | ICD-10-CM | POA: Diagnosis not present

## 2017-11-20 DIAGNOSIS — M25562 Pain in left knee: Secondary | ICD-10-CM | POA: Diagnosis not present

## 2017-11-20 DIAGNOSIS — M25561 Pain in right knee: Secondary | ICD-10-CM | POA: Diagnosis not present

## 2017-11-20 DIAGNOSIS — R2689 Other abnormalities of gait and mobility: Secondary | ICD-10-CM | POA: Diagnosis not present

## 2017-11-24 DIAGNOSIS — R2689 Other abnormalities of gait and mobility: Secondary | ICD-10-CM | POA: Diagnosis not present

## 2017-11-24 DIAGNOSIS — M25561 Pain in right knee: Secondary | ICD-10-CM | POA: Diagnosis not present

## 2017-11-24 DIAGNOSIS — M25562 Pain in left knee: Secondary | ICD-10-CM | POA: Diagnosis not present

## 2017-11-30 ENCOUNTER — Ambulatory Visit: Payer: PPO | Attending: Orthopedic Surgery | Admitting: Physical Therapy

## 2017-11-30 DIAGNOSIS — R2681 Unsteadiness on feet: Secondary | ICD-10-CM | POA: Diagnosis not present

## 2017-11-30 DIAGNOSIS — M25661 Stiffness of right knee, not elsewhere classified: Secondary | ICD-10-CM | POA: Insufficient documentation

## 2017-11-30 DIAGNOSIS — M25662 Stiffness of left knee, not elsewhere classified: Secondary | ICD-10-CM | POA: Insufficient documentation

## 2017-11-30 NOTE — Therapy (Addendum)
[facility] [address], [place], [address] Phone: [phone]   Fax:  [phone]  Physical Therapy Evaluation  Patient Details  Name: Alicia Mcbride MRN: [id number] Date of Birth: 1951-01-22 Referring Provider: Gaynelle Arabian   Encounter Date: 11/30/2017  PT End of Session - 11/30/17 1712    Visit Number  1    Number of Visits  16    Date for PT Re-Evaluation  12/31/17    Authorization Type  [employer]    Authorization Time Period  11/30/17-12/29/17    Authorization - Visit Number  1    Authorization - Number of Visits  10    PT Start Time  1602    PT Stop Time  1658    PT Time Calculation (min)  56 min    Activity Tolerance  Patient tolerated treatment well;Patient limited by fatigue    Behavior During Therapy  [facility] for tasks assessed/performed       Past Medical History:  Diagnosis Date  . Allergy   . Gynecological examination    sees Dr. Alden Hipp  . Hypertension   . PONV (postoperative nausea and vomiting)     Past Surgical History:  Procedure Laterality Date  . COLONOSCOPY  April 2007   per Dr. Olevia Perches, polyp, repeat in 10 yrs   . TONSILLECTOMY    . 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 TUBAL LIGATION  1985    There were no vitals filed for this visit.   Subjective Assessment - 11/30/17 1612    Subjective  Pt underwent bilat TKA with Dr. Wynelle Link on Jan 2 at [facility] and DC to [facility] from 1/7-1/19. Pt had ortostasis issues while admited but no additional problems s/p DC.     Pertinent History  No prior history knee/hip surgery.     Limitations  Standing;Walking    How long can you sit comfortably?  not limited    How long can you stand comfortably?  10-15 minutes (showing and dental hygiene)     How long can you walk comfortably?   ~315ft at STR.     Patient Stated Goals  Be able to walk on sand and play on beach with grandkids.     Currently in Pain?  No/denies discomfort only, wth stiffness, but no true pain.     Pain Orientation  -- bilat knees         [facility] PT Assessment - 11/30/17 0001      Assessment   Medical Diagnosis  Bilat TKA     Referring Provider  Pilar Plate Aluisio    Onset Date/Surgical Date  11/11/17    Hand Dominance  Left    Next MD Visit  Dec 15, 2017    Prior Therapy  Acute, STR      Precautions   Precautions  Knee      Restrictions   Weight Bearing Restrictions  Yes    Other Position/Activity Restrictions  WBAt      Balance Screen   Has the patient fallen in the past 6 months  No    Has the patient had a decrease in activity level because of a fear of falling?   No    Is the patient reluctant to leave their home because of a fear of falling?   No  Prior Function   Level of Independence  Independent with basic ADLs;Independent with community mobility with device    Vocation  Retired      Technical sales engineer tests  Sit to [employer] to Stand   Comments  5xSTS: 23.96s chair+airex, hands on knees      ROM / Strength   AROM / PROM / Strength  Strength;PROM      PROM   PROM Assessment Site  Knee    Right/Left Knee  Right;Left    Right Knee Extension  -18    Right Knee Flexion  96    Left Knee Extension  -19    Left Knee Flexion  100      Strength   Strength Assessment Site  Hip;Knee;Ankle    Right/Left Hip  Right;Left    Right Hip External Rotation   -- to painful to test    Right Hip Internal Rotation  4/5    Right Hip ABduction  -- 4/5 horizontal abd    Right Hip ADduction  4/5    Left Hip Flexion  3+/5    Left Hip External Rotation  -- to painful to test    Left Hip Internal Rotation  4/5    Left Hip ABduction  -- 4/5 horizontal abd    Left Hip ADduction  4/5    Right/Left Knee  Right;Left    Right Knee Flexion  4+/5    Right Knee Extension  5/5     Left Knee Flexion  4/5    Left Knee Extension  4+/5    Right/Left Ankle  Right;Left    Right Ankle Dorsiflexion  5/5    Left Ankle Dorsiflexion  5/5      Ambulation/Gait   Assistive device  Rolling walker    Gait Comments  0.12m/s 10MWT         12/01/17 0001  Observation/Other Assessments  Other Surveys  Other Surveys  Lower Extremity Functional Scale  18/80 (completed 11/30/17)          Objective measurements completed on examination: See above findings.     Therex This Session: -Heel raises: 1x15 (hep update) -stair lunge knee flexion stretch: 10x3 sec bilat (hep updated)  -crook lying bridging: 1x10 (hep addition)  -SAQ 1x10 bilat on bolster (hep addition)  -supine marching (P/ROM knee flexion self mobilization) 1x10bilat          PT Education - 11/30/17 1711    Education provided  Yes    Education Details  HEP review and updates; edema management     Person(s) Educated  Patient    Methods  Explanation;Demonstration;Verbal cues;Handout    Comprehension  Need further instruction       PT Short Term Goals - 11/30/17 1727      PT SHORT TERM GOAL #1   Title  After 4 weeks patient will demonstrate improve bilat knee P/ROM to 12-113 degrees bilat to improve tolerance to ADL performance.     Time  4    Period  Weeks    Status  New    Target Date  12/28/17      PT SHORT TERM GOAL #2   Title  After 4 weeks patient will demonstrate improved functional mobility AEB tolerance of 3 minutes sustained walking s AD, averaging 0.53m/s.     Time  4    Period  Weeks    Status  New    Target Date  12/28/17  PT SHORT TERM GOAL #3   Title  After 4 weeks patient will demonstrate improved functional strength as evidenced by 5xSTS <20sec hands free from chair.     Time  4    Period  Weeks    Status  New    Target Date  12/28/17        PT Long Term Goals - 11/30/17 1733      PT LONG TERM GOAL #1   Title  After 8 weeks patient will demonstrate improved bilat  knee ROM <10 degrees to >120 degrees bilat.     Time  8    Period  Weeks    Status  New    Target Date  01/25/18      PT LONG TERM GOAL #2   Title  After 8 weeks patient will demonstrate improved functional strength AEB 5xSTS<11 seconds from standard chair height hands free.     Time  8    Period  Weeks    Status  New    Target Date  01/25/18      PT LONG TERM GOAL #3   Title  After 8 weeks patient will demonstrate improved tolerance to community distance AMB AEB 6MWT averagiing 1.2m/s or greater.     Time  8    Period  Weeks    Status  New    Target Date  01/25/18      PT LONG TERM GOAL #4   Title  After 8 weeks patient will demonstrate improved single limb stability on unstable surfaces AEB SLS>15sec bilar on airex to improve tolerance to AMB on the beach.     Time  8    Period  Weeks    Status  New    Target Date  01/25/18             Plan - 11/30/17 1716    Clinical Impression Statement  Pt presenting 19d s/p bilat TKA with Dr. Gaynelle Arabian. Today she demonstrates impairment of strength and joint mobility, with joint effusion decreaesd ROM, impaired balance, decreased gait speed, decreaed tolrance to activity, and additional impairment detailed in this note. Some items of examination are limited d/t pain at this time. ROM limitations in bilat knees are WNL for postoperative heeling timelines, but are more limited in extesion ROM, ~20 degrees bilat. Pt will benefit from skilled PT intervention to address detailed  impairment and limitation, to restore patient to PLOF in ADL, IADL, and leisure activity.     History and Personal Factors relevant to plan of care:  Highly motivated, pain well controlled, demonstrates fast learninng as pertaining to HEP.     Clinical Presentation  Stable    Clinical Presentation due to:  objective tests and measures.     Clinical Decision Making  Moderate    Rehab Potential  Good    PT Frequency  2x / week MD recommending 3x weekly, however  patient is very independent and doing well. No clinical need for 3x weekly.     PT Duration  4 weeks    PT Treatment/Interventions  ADLs/Self Care Home Management;Balance training;Cryotherapy;Electrical Stimulation;Therapeutic exercise;Therapeutic activities;Functional mobility training;Stair training;Gait training;DME Instruction;Cognitive remediation;Scar mobilization;Compression bandaging;Passive range of motion;Dry needling    PT Next Visit Plan  LEFS, HEP review, review treatment goals, abuse screening questions.     PT Home Exercise Plan  At eval: heel slides, SAQ, supine bridges, LAQ, supine marching, heel raises.     Consulted and Agree with Plan of Care  Patient       Patient will benefit from skilled therapeutic intervention in order to improve the following deficits and impairments:  Abnormal gait, Hypomobility, Impaired sensation, Decreased knowledge of precautions, Decreased scar mobility, Increased edema, Decreased activity tolerance, Decreased strength, Pain, Decreased balance, Decreased mobility, Difficulty walking, Decreased range of motion, Postural dysfunction  Visit Diagnosis: Stiffness of right knee, not elsewhere classified - Plan: PT plan of care cert/re-cert  Stiffness of left knee, not elsewhere classified - Plan: PT plan of care cert/re-cert  Unsteadiness on feet - Plan: PT plan of care cert/re-cert     Problem List Patient Active Problem List   Diagnosis Date Noted  . OA (osteoarthritis) of knee 04/29/2013  . LOW BACK PAIN 11/21/2010  . VIRAL URI 01/02/2010  . EXTERNAL HEMORRHOIDS 01/20/2008  . Hyperlipemia, mixed 07/23/2007  . Essential hypertension 07/23/2007    6:08 PM, 11/30/17 Etta Grandchild, PT, DPT Relief Physical Therapist - [place] [phone] (Office)    Etta Grandchild 11/30/2017, 6:08 PM  [facility] [address] [place], [address] Phone: [phone]   Fax:   [phone]  Name: FABIHA ROUGEAU MRN: [id number] Date of Birth: 09/11/1951

## 2017-12-02 ENCOUNTER — Ambulatory Visit: Payer: PPO | Admitting: Physical Therapy

## 2017-12-02 ENCOUNTER — Encounter: Payer: Self-pay | Admitting: Physical Therapy

## 2017-12-02 DIAGNOSIS — M25662 Stiffness of left knee, not elsewhere classified: Secondary | ICD-10-CM

## 2017-12-02 DIAGNOSIS — M25661 Stiffness of right knee, not elsewhere classified: Secondary | ICD-10-CM

## 2017-12-02 NOTE — Therapy (Addendum)
[facility] [address], [place], [address] Phone: [phone]   Fax:  [phone]  Physical Therapy Treatment  Patient Details  Name: Alicia Mcbride MRN: [id number] Date of Birth: Mar 16, 1951 Referring Provider: Gaynelle Arabian   Encounter Date: 12/02/2017    Past Medical History:  Diagnosis Date  . Allergy   . Gynecological examination    sees Dr. Alden Hipp  . Hypertension   . PONV (postoperative nausea and vomiting)     Past Surgical History:  Procedure Laterality Date  . COLONOSCOPY  April 2007   per Dr. Olevia Perches, polyp, repeat in 10 yrs   . TONSILLECTOMY    . 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 TUBAL LIGATION  1985    There were no vitals filed for this visit.  Subjective Assessment - 12/02/17 1301    Subjective  Pt reports that she does not have any pain today, just stiffness. She reports that at night she has a aching pain in both knees that feels deep.    Patient is accompained by:  Family member husband    Pertinent History  No prior history knee/hip surgery.     Limitations  Standing;Walking    How long can you sit comfortably?  not limited    How long can you stand comfortably?  10-15 minutes (showing and dental hygiene)     How long can you walk comfortably?  ~360ft at STR.     Patient Stated Goals  Be able to walk on sand and play on beach with grandkids.     Currently in Pain?  No/denies    Pain Location  Knee    Pain Orientation  Left;Right    Pain Descriptors / Indicators  Aching    Pain Type  Acute pain           Manual -Bilateral patella mobs medial/lateral and sup/inf 62min each LE -Flexion heel slide stretch with PT overpressure 3x 30sec holds each LE -Extension prop stretch with PT overpressure 3x 30sec holds each  LE -Incision scar massage 22min each LE  There-Ex -Quad Sets 3x 10/12/15reps  -SLR 3sets 7reps raising only 2in (as much as pt can do w/o quad lag at this time) with VC for sustained contraction and eccentric control   - HEP review, adding hamstring stair stretch w/ flexion stair stretch -Goal review and education on aim of exercises to reach goals with education on exercise progression                     PT Short Term Goals - 11/30/17 1727      PT SHORT TERM GOAL #1   Title  After 4 weeks patient will demonstrate improve bilat knee P/ROM to 12-113 degrees bilat to improve tolerance to ADL performance.     Time  4    Period  Weeks    Status  New    Target Date  12/28/17      PT SHORT TERM GOAL #2   Title  After 4 weeks patient will demonstrate improved functional mobility AEB tolerance of 3 minutes sustained walking s AD, averaging 0.18m/s.     Time  4    Period  Weeks    Status  New    Target Date  12/28/17      PT SHORT  TERM GOAL #3   Title  After 4 weeks patient will demonstrate improved functional strength as evidenced by 5xSTS <20sec hands free from chair.     Time  4    Period  Weeks    Status  New    Target Date  12/28/17        PT Long Term Goals - 11/30/17 1733      PT LONG TERM GOAL #1   Title  After 8 weeks patient will demonstrate improved bilat knee ROM <10 degrees to >120 degrees bilat.     Time  8    Period  Weeks    Status  New    Target Date  01/25/18      PT LONG TERM GOAL #2   Title  After 8 weeks patient will demonstrate improved functional strength AEB 5xSTS<11 seconds from standard chair height hands free.     Time  8    Period  Weeks    Status  New    Target Date  01/25/18      PT LONG TERM GOAL #3   Title  After 8 weeks patient will demonstrate improved tolerance to community distance AMB AEB 6MWT averagiing 1.15m/s or greater.     Time  8    Period  Weeks    Status  New    Target Date  01/25/18      PT LONG TERM  GOAL #4   Title  After 8 weeks patient will demonstrate improved single limb stability on unstable surfaces AEB SLS>15sec bilar on airex to improve tolerance to AMB on the beach.     Time  8    Period  Weeks    Status  New    Target Date  01/25/18              Patient will benefit from skilled therapeutic intervention in order to improve the following deficits and impairments:     Visit Diagnosis: Stiffness of right knee, not elsewhere classified  Stiffness of left knee, not elsewhere classified     Problem List Patient Active Problem List   Diagnosis Date Noted  . OA (osteoarthritis) of knee 04/29/2013  . LOW BACK PAIN 11/21/2010  . VIRAL URI 01/02/2010  . EXTERNAL HEMORRHOIDS 01/20/2008  . Hyperlipemia, mixed 07/23/2007  . Essential hypertension 07/23/2007   Shelton Silvas, PT, DPT Shelton Silvas 12/02/2017, 1:08 PM  [facility] [address] [place], [address] Phone: [phone]   Fax:  [phone]  Name: Alicia Mcbride MRN: [id number] Date of Birth: Jul 22, 1951

## 2017-12-04 ENCOUNTER — Ambulatory Visit: Payer: PPO | Admitting: Physical Therapy

## 2017-12-07 ENCOUNTER — Encounter: Payer: Self-pay | Admitting: Physical Therapy

## 2017-12-07 ENCOUNTER — Ambulatory Visit: Payer: PPO | Admitting: Physical Therapy

## 2017-12-07 DIAGNOSIS — M25661 Stiffness of right knee, not elsewhere classified: Secondary | ICD-10-CM | POA: Diagnosis not present

## 2017-12-07 DIAGNOSIS — R2681 Unsteadiness on feet: Secondary | ICD-10-CM

## 2017-12-07 DIAGNOSIS — M25662 Stiffness of left knee, not elsewhere classified: Secondary | ICD-10-CM

## 2017-12-07 NOTE — Therapy (Signed)
[facility] [address], [place], [address] Phone: [phone]   Fax:  [phone]  Physical Therapy Treatment  Patient Details  Name: Alicia Mcbride MRN: [id number] Date of Birth: 1951-04-04 Referring Provider: Gaynelle Arabian   Encounter Date: 12/07/2017    Past Medical History:  Diagnosis Date  . Allergy   . Gynecological examination    sees Dr. Alden Hipp  . Hypertension   . PONV (postoperative nausea and vomiting)     Past Surgical History:  Procedure Laterality Date  . COLONOSCOPY  April 2007   per Dr. Olevia Perches, polyp, repeat in 10 yrs   . TONSILLECTOMY    . 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 TUBAL LIGATION  1985    There were no vitals filed for this visit.  Subjective Assessment - 12/07/17 1601    Subjective  Pt reports she is no longer taking pain medication other than a muscle relaxor. She reports she still has very mild pain that she localizes to the patellar tendon. She reports compliance with her HEP and that she is weaning off the walker at home. pt has no questions or concerns at this time.     Patient is accompained by:  Family member    Pertinent History  No prior history knee/hip surgery.     Limitations  Standing;Walking    How long can you sit comfortably?  not limited    How long can you stand comfortably?  10-15 minutes (showing and dental hygiene)     How long can you walk comfortably?  ~351ft at STR.     Patient Stated Goals  Be able to walk on sand and play on beach with grandkids.           Manual -Patella mobs w/ scar tissue massage while other LE was in prop ext stretch 84min a bout for 3 bouts each LE -Supine flexion stretch with PT overpressure 3x 45sec each LE -Prop ext stretch w/ PT overpressure 3x 45sec each  LE  Ther-Ex -SLR w/ 1.5# ankle wts 3 sets 10/10/6 each with TC to prevent quad lag (raising only 3 in to prevent quad lag) -Standing hip abd 3x 10 w/ 1.5# with VC needed for glute activation -Standing hip ext 3x 10 w/ 1.5# with VC needed for glute activation -Clamshell 3x 10 each LE with TC and VC  -Stair flexion and hamstring stretch                         PT Short Term Goals - 11/30/17 1727      PT SHORT TERM GOAL #1   Title  After 4 weeks patient will demonstrate improve bilat knee P/ROM to 12-113 degrees bilat to improve tolerance to ADL performance.     Time  4    Period  Weeks    Status  New    Target Date  12/28/17      PT SHORT TERM GOAL #2   Title  After 4 weeks patient will demonstrate improved functional mobility AEB tolerance of 3 minutes sustained walking s AD, averaging 0.12m/s.     Time  4    Period  Weeks    Status  New    Target Date  12/28/17      PT SHORT TERM GOAL #  3   Title  After 4 weeks patient will demonstrate improved functional strength as evidenced by 5xSTS <20sec hands free from chair.     Time  4    Period  Weeks    Status  New    Target Date  12/28/17        PT Long Term Goals - 11/30/17 1733      PT LONG TERM GOAL #1   Title  After 8 weeks patient will demonstrate improved bilat knee ROM <10 degrees to >120 degrees bilat.     Time  8    Period  Weeks    Status  New    Target Date  01/25/18      PT LONG TERM GOAL #2   Title  After 8 weeks patient will demonstrate improved functional strength AEB 5xSTS<11 seconds from standard chair height hands free.     Time  8    Period  Weeks    Status  New    Target Date  01/25/18      PT LONG TERM GOAL #3   Title  After 8 weeks patient will demonstrate improved tolerance to community distance AMB AEB 6MWT averagiing 1.52m/s or greater.     Time  8    Period  Weeks    Status  New    Target Date  01/25/18      PT LONG TERM GOAL #4   Title  After 8 weeks patient will  demonstrate improved single limb stability on unstable surfaces AEB SLS>15sec bilar on airex to improve tolerance to AMB on the beach.     Time  8    Period  Weeks    Status  New    Target Date  01/25/18              Patient will benefit from skilled therapeutic intervention in order to improve the following deficits and impairments:     Visit Diagnosis: No diagnosis found.     Problem List Patient Active Problem List   Diagnosis Date Noted  . OA (osteoarthritis) of knee 04/29/2013  . LOW BACK PAIN 11/21/2010  . VIRAL URI 01/02/2010  . EXTERNAL HEMORRHOIDS 01/20/2008  . Hyperlipemia, mixed 07/23/2007  . Essential hypertension 07/23/2007   Shelton Silvas PT, DPT Shelton Silvas 12/07/2017, 4:05 PM  [facility] [address] [place], [address] Phone: [phone]   Fax:  [phone]  Name: SILVANA HOLECEK MRN: [id number] Date of Birth: 10/16/1951

## 2017-12-09 ENCOUNTER — Ambulatory Visit: Payer: PPO | Admitting: Physical Therapy

## 2017-12-09 ENCOUNTER — Encounter: Payer: Self-pay | Admitting: Physical Therapy

## 2017-12-09 DIAGNOSIS — M25662 Stiffness of left knee, not elsewhere classified: Secondary | ICD-10-CM

## 2017-12-09 DIAGNOSIS — M25661 Stiffness of right knee, not elsewhere classified: Secondary | ICD-10-CM

## 2017-12-09 NOTE — Therapy (Signed)
[facility] [address] [place], [address] Phone: [phone]   Fax:  [phone]  Physical Therapy Treatment  Patient Details  Name: Alicia Mcbride MRN: [id number] Date of Birth: 05/18/1951 Referring Provider: Gaynelle Arabian   Encounter Date: 12/09/2017    Past Medical History:  Diagnosis Date  . Allergy   . Gynecological examination    sees Dr. Alden Hipp  . Hypertension   . PONV (postoperative nausea and vomiting)     Past Surgical History:  Procedure Laterality Date  . COLONOSCOPY  April 2007   per Dr. Olevia Perches, polyp, repeat in 10 yrs   . TONSILLECTOMY    . 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 TUBAL LIGATION  1985    There were no vitals filed for this visit.    Manual -Supine tibial mobs in flexion GIII 30sec bouts 4 bouts each LE, while opposite extremity is propped in extension stretch  -Patella mobs 30sec bouts inf/sup 4x each LE during ^ -Elys flexion stretch (prone) 6x 30sec holds each LE  92 foot  -Supine propped stretch w/ PT overpressure 4x 30sec holds each LE  Gait Training - 34ft with standing rest (46 ft rest 46 ft) w/o walker, utilizing gait belt and PT CGA for safety with VC and TC to initiate tibial translation, and full ankle ROM. Pt required consist cuing to ambulate as "close to normal as possible" with available ext/flex *following ambulation pt reported feeling "dizzy" and needed to sit down. PT took BP which read 110/60, which pt reported was a "little low for her". She reported she had not ate lunch yet, but was going directly after PT. Seated exercise followed"  Ther-Ex  -Stair flexion stretch 30sec hold alternating with 30sec hold hamstring stretch x3 each LE -SAQ 2x 10 each LE (following dizzy spell). Patient reported she  had been doing these stretches at home but was "bouncing" and was instructed to sustain static holds instead   Following manual PROM (AROM with overpressure) measurements (flexion taken in prone, ext taken in supine prop position) Flexion: R 90d L88d Extension: R -10d L -12d                      PT Short Term Goals - 11/30/17 1727      PT SHORT TERM GOAL #1   Title  After 4 weeks patient will demonstrate improve bilat knee P/ROM to 12-113 degrees bilat to improve tolerance to ADL performance.     Time  4    Period  Weeks    Status  New    Target Date  12/28/17      PT SHORT TERM GOAL #2   Title  After 4 weeks patient will demonstrate improved functional mobility AEB tolerance of 3 minutes sustained walking s AD, averaging 0.10m/s.     Time  4    Period  Weeks    Status  New    Target Date  12/28/17      PT SHORT TERM GOAL #3   Title  After 4 weeks patient will demonstrate improved functional strength as evidenced by 5xSTS <20sec hands free from chair.     Time  4    Period  Weeks    Status  New    Target Date  12/28/17  PT Long Term Goals - 11/30/17 1733      PT LONG TERM GOAL #1   Title  After 8 weeks patient will demonstrate improved bilat knee ROM <10 degrees to >120 degrees bilat.     Time  8    Period  Weeks    Status  New    Target Date  01/25/18      PT LONG TERM GOAL #2   Title  After 8 weeks patient will demonstrate improved functional strength AEB 5xSTS<11 seconds from standard chair height hands free.     Time  8    Period  Weeks    Status  New    Target Date  01/25/18      PT LONG TERM GOAL #3   Title  After 8 weeks patient will demonstrate improved tolerance to community distance AMB AEB 6MWT averagiing 1.76m/s or greater.     Time  8    Period  Weeks    Status  New    Target Date  01/25/18      PT LONG TERM GOAL #4   Title  After 8 weeks patient will demonstrate improved single limb stability on unstable surfaces AEB  SLS>15sec bilar on airex to improve tolerance to AMB on the beach.     Time  8    Period  Weeks    Status  New    Target Date  01/25/18              Patient will benefit from skilled therapeutic intervention in order to improve the following deficits and impairments:     Visit Diagnosis: No diagnosis found.     Problem List Patient Active Problem List   Diagnosis Date Noted  . OA (osteoarthritis) of knee 04/29/2013  . LOW BACK PAIN 11/21/2010  . VIRAL URI 01/02/2010  . EXTERNAL HEMORRHOIDS 01/20/2008  . Hyperlipemia, mixed 07/23/2007  . Essential hypertension 07/23/2007   Shelton Silvas PT, DPT  Shelton Silvas 12/09/2017, 12:58 PM  [facility] [address] [place], [address] Phone: [phone]   Fax:  [phone]  Name: Alicia Mcbride MRN: [id number] Date of Birth: [birth year]

## 2017-12-11 ENCOUNTER — Encounter: Payer: PPO | Admitting: Physical Therapy

## 2017-12-15 ENCOUNTER — Encounter: Payer: PPO | Admitting: Physical Therapy

## 2017-12-15 DIAGNOSIS — Z4889 Encounter for other specified surgical aftercare: Secondary | ICD-10-CM | POA: Diagnosis not present

## 2017-12-15 DIAGNOSIS — M170 Bilateral primary osteoarthritis of knee: Secondary | ICD-10-CM | POA: Diagnosis not present

## 2017-12-16 ENCOUNTER — Ambulatory Visit: Payer: PPO | Attending: Orthopedic Surgery | Admitting: Physical Therapy

## 2017-12-16 ENCOUNTER — Encounter: Payer: PPO | Admitting: Physical Therapy

## 2017-12-16 ENCOUNTER — Encounter: Payer: Self-pay | Admitting: Physical Therapy

## 2017-12-16 DIAGNOSIS — R2681 Unsteadiness on feet: Secondary | ICD-10-CM | POA: Diagnosis not present

## 2017-12-16 DIAGNOSIS — M25662 Stiffness of left knee, not elsewhere classified: Secondary | ICD-10-CM

## 2017-12-16 DIAGNOSIS — M25661 Stiffness of right knee, not elsewhere classified: Secondary | ICD-10-CM | POA: Diagnosis not present

## 2017-12-16 NOTE — Therapy (Signed)
[facility] [address], [place], [address] Phone: [phone]   Fax:  [phone]  Physical Therapy Treatment  Patient Details  Name: Alicia Mcbride MRN: [id number] Date of Birth: 13-Mar-1951 Referring Provider: Gaynelle Arabian   Encounter Date: 12/16/2017  PT End of Session - 12/16/17 0847    Visit Number  5    Number of Visits  18    Date for PT Re-Evaluation  12/31/17    Authorization Type  [employer]    Authorization Time Period  11/30/17-12/29/17    Authorization - Visit Number  2    Authorization - Number of Visits  10    PT Start Time  0817    PT Stop Time  0902    PT Time Calculation (min)  45 min    Activity Tolerance  Patient tolerated treatment well;Treatment limited secondary to medical complications (Comment)    Behavior During Therapy  [facility] for tasks assessed/performed       Past Medical History:  Diagnosis Date  . Allergy   . Gynecological examination    sees Dr. Alden Hipp  . Hypertension   . PONV (postoperative nausea and vomiting)     Past Surgical History:  Procedure Laterality Date  . COLONOSCOPY  April 2007   per Dr. Olevia Perches, polyp, repeat in 10 yrs   . TONSILLECTOMY    . 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 TUBAL LIGATION  1985    There were no vitals filed for this visit.  Subjective Assessment - 12/16/17 0826    Subjective  Pt reports she is doing well, still has some soreness on the L knee, but that she is able to manage it with ice. She reports surgeon gave her a good report yesterday and has authorized 2 more weeks of PT. No questions or concerns at this time.    Pertinent History  No prior history knee/hip surgery.     Limitations  Standing;Walking    How long can you sit comfortably?  not limited    How long can you  stand comfortably?  10-15 minutes (showing and dental hygiene)     How long can you walk comfortably?  ~335ft at STR.     Patient Stated Goals  Be able to walk on sand and play on beach with grandkids.             Ther-Ex -Bike warm-up for ROM 68min during history intake (by minute 4 patient was able to get all the way around and backwards) -Flexion stretch w/ PT overpressure 3x 30sec holds bilat -Extension prop stretch w/ PT overpressure 3x 30sec holds bilat -OMEGA leg ext 3x 10 10# w/ VC for eccentric control -OMEGA hamstring curl 3x 10 10# w/ VC for eccentric control -OMEGA leg press 3x 10 10# w/ VC for eccentric control -Mini Squats at treadmill bar w/ 2 finger support and chair behind patient for safety w/  -Standing marches on airex pad with 2 finger support on treadmill bar 20x (10each LE) x3 sets                   PT Education - 12/16/17 0841    Education provided  Yes    Education Details  Exercise form    Person(s) Educated  Patient    Methods  Explanation;Verbal cues  Comprehension  Verbalized understanding;Returned demonstration       PT Short Term Goals - 11/30/17 1727      PT SHORT TERM GOAL #1   Title  After 4 weeks patient will demonstrate improve bilat knee P/ROM to 12-113 degrees bilat to improve tolerance to ADL performance.     Time  4    Period  Weeks    Status  New    Target Date  12/28/17      PT SHORT TERM GOAL #2   Title  After 4 weeks patient will demonstrate improved functional mobility AEB tolerance of 3 minutes sustained walking s AD, averaging 0.43m/s.     Time  4    Period  Weeks    Status  New    Target Date  12/28/17      PT SHORT TERM GOAL #3   Title  After 4 weeks patient will demonstrate improved functional strength as evidenced by 5xSTS <20sec hands free from chair.     Time  4    Period  Weeks    Status  New    Target Date  12/28/17        PT Long Term Goals - 11/30/17 1733      PT LONG TERM GOAL #1    Title  After 8 weeks patient will demonstrate improved bilat knee ROM <10 degrees to >120 degrees bilat.     Time  8    Period  Weeks    Status  New    Target Date  01/25/18      PT LONG TERM GOAL #2   Title  After 8 weeks patient will demonstrate improved functional strength AEB 5xSTS<11 seconds from standard chair height hands free.     Time  8    Period  Weeks    Status  New    Target Date  01/25/18      PT LONG TERM GOAL #3   Title  After 8 weeks patient will demonstrate improved tolerance to community distance AMB AEB 6MWT averagiing 1.77m/s or greater.     Time  8    Period  Weeks    Status  New    Target Date  01/25/18      PT LONG TERM GOAL #4   Title  After 8 weeks patient will demonstrate improved single limb stability on unstable surfaces AEB SLS>15sec bilar on airex to improve tolerance to AMB on the beach.     Time  8    Period  Weeks    Status  New    Target Date  01/25/18            Plan - 12/16/17 0856    Clinical Impression Statement  Pt is continuing to make good progress toward goals. Patient tolerated treatment progression well with no noted pain. PT will continue to progress strengthening and balance exercises as able.     Clinical Presentation  Stable    Rehab Potential  Good    PT Frequency  2x / week    PT Treatment/Interventions  ADLs/Self Care Home Management;Balance training;Cryotherapy;Electrical Stimulation;Therapeutic exercise;Therapeutic activities;Functional mobility training;Stair training;Gait training;DME Instruction;Cognitive remediation;Scar mobilization;Compression bandaging;Passive range of motion;Dry needling    PT Next Visit Plan  Strengthening progression, gait training    PT Home Exercise Plan  At eval: heel slides, SAQ, supine bridges, LAQ, supine marching, heel raises.     Consulted and Agree with Plan of Care  Patient  Patient will benefit from skilled therapeutic intervention in order to improve the following deficits  and impairments:  Abnormal gait, Hypomobility, Impaired sensation, Decreased knowledge of precautions, Decreased scar mobility, Increased edema, Decreased activity tolerance, Decreased strength, Pain, Decreased balance, Decreased mobility, Difficulty walking, Decreased range of motion, Postural dysfunction  Visit Diagnosis: Stiffness of right knee, not elsewhere classified  Stiffness of left knee, not elsewhere classified  Unsteadiness on feet     Problem List Patient Active Problem List   Diagnosis Date Noted  . OA (osteoarthritis) of knee 04/29/2013  . LOW BACK PAIN 11/21/2010  . VIRAL URI 01/02/2010  . EXTERNAL HEMORRHOIDS 01/20/2008  . Hyperlipemia, mixed 07/23/2007  . Essential hypertension 07/23/2007   Shelton Silvas PT, DPT Shelton Silvas 12/16/2017, 10:17 AM  [facility] [address], [place], [address] Phone: [phone]   Fax:  [phone]  Name: Alicia Mcbride MRN: [id number] Date of Birth: 04-08-1951

## 2017-12-16 NOTE — Patient Instructions (Signed)
Discontinue LAQ and bridges as patient reports they are "too easy" added:

## 2017-12-18 ENCOUNTER — Ambulatory Visit: Payer: PPO | Admitting: Physical Therapy

## 2017-12-18 ENCOUNTER — Encounter: Payer: Self-pay | Admitting: Physical Therapy

## 2017-12-18 DIAGNOSIS — M25661 Stiffness of right knee, not elsewhere classified: Secondary | ICD-10-CM | POA: Diagnosis not present

## 2017-12-18 DIAGNOSIS — R2681 Unsteadiness on feet: Secondary | ICD-10-CM

## 2017-12-18 DIAGNOSIS — M25662 Stiffness of left knee, not elsewhere classified: Secondary | ICD-10-CM

## 2017-12-18 NOTE — Therapy (Signed)
[facility] [address] [place], [address] Phone: [phone]   Fax:  [phone]  Physical Therapy Treatment  Patient Details  Name: Alicia Mcbride MRN: [id number] Date of Birth: Mar 19, 1951 Referring Provider: Gaynelle Arabian   Encounter Date: 12/18/2017  PT End of Session - 12/18/17 1000    Visit Number  6    Number of Visits  18    Date for PT Re-Evaluation  12/31/17    PT Start Time  0957    PT Stop Time  1040    PT Time Calculation (min)  43 min    Equipment Utilized During Treatment  Gait belt    Activity Tolerance  Patient tolerated treatment well;Treatment limited secondary to medical complications (Comment)    Behavior During Therapy  [facility] for tasks assessed/performed       Past Medical History:  Diagnosis Date  . Allergy   . Gynecological examination    sees Dr. Alden Hipp  . Hypertension   . PONV (postoperative nausea and vomiting)     Past Surgical History:  Procedure Laterality Date  . COLONOSCOPY  April 2007   per Dr. Olevia Perches, polyp, repeat in 10 yrs   . TONSILLECTOMY    . 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 TUBAL LIGATION  1985    There were no vitals filed for this visit.  Subjective Assessment - 12/18/17 0958    Subjective  Pt reports no pain, but some soreness in the "back of the L knee". She reports compliance with her HEP. No questions or concerns at this time.     Patient is accompained by:  Family member    Pertinent History  No prior history knee/hip surgery.     Limitations  Standing;Walking    How long can you sit comfortably?  not limited    How long can you stand comfortably?  10-15 minutes (showing and dental hygiene)     How long can you walk comfortably?  ~34ft at STR.     Patient Stated Goals  Be able to walk on sand  and play on beach with grandkids.     Currently in Pain?  No/denies          Ther-Ex -Bike for ROM scooting seat forward to continue challenging ROM 69mins during hx intake -Stair stretch (bending LE on stair and straightening other) 30sec x 3 each leg -OMEGA knee ext 3x 10 10# w/ min VC for eccentric control needed -OMEGA hamstring curl 3x 10 15#  -Mini Squats w/ yellow tband (just proximal) to knees for glute med contraction 3x 12 with min VC needed for full hip ext -Standing hip abd 2 x12 w/ VC for eccentric control and proper posutre  Therapeutic Activities: -4 trials of stair ambulation (4 steps up and down) with unilateral R handrail support- pt is able to ambulate up steps with reciprocal gait with use of handrail. PT cued patient for hip ext to activate glutes for ascending steps, which she was able to do well. Pt descends stair with a step-to pattern as she cannot flex the L knee with eccentric control needed to lower R LE onto step. With PT cuing and CGA pt was able to descend w/ reciprocal pattern, losing eccentric control 50% of lowering -Gait ambulation w/o AD pt lacks active knee ext on L  during swing phase and lacks toe push off bilat . Patient was able to correct these to normalize gait 50% Patient was instructed to continue to attempt to normalize gait at home (she is amb at home w/o AD                   PT Education - 12/18/17 0959    Education provided  Yes    Education Details  Exercise form    Person(s) Educated  Patient    Methods  Explanation    Comprehension  Verbalized understanding;Returned demonstration       PT Short Term Goals - 11/30/17 1727      PT SHORT TERM GOAL #1   Title  After 4 weeks patient will demonstrate improve bilat knee P/ROM to 12-113 degrees bilat to improve tolerance to ADL performance.     Time  4    Period  Weeks    Status  New    Target Date  12/28/17      PT SHORT TERM GOAL #2   Title  After 4 weeks patient will  demonstrate improved functional mobility AEB tolerance of 3 minutes sustained walking s AD, averaging 0.23m/s.     Time  4    Period  Weeks    Status  New    Target Date  12/28/17      PT SHORT TERM GOAL #3   Title  After 4 weeks patient will demonstrate improved functional strength as evidenced by 5xSTS <20sec hands free from chair.     Time  4    Period  Weeks    Status  New    Target Date  12/28/17        PT Long Term Goals - 11/30/17 1733      PT LONG TERM GOAL #1   Title  After 8 weeks patient will demonstrate improved bilat knee ROM <10 degrees to >120 degrees bilat.     Time  8    Period  Weeks    Status  New    Target Date  01/25/18      PT LONG TERM GOAL #2   Title  After 8 weeks patient will demonstrate improved functional strength AEB 5xSTS<11 seconds from standard chair height hands free.     Time  8    Period  Weeks    Status  New    Target Date  01/25/18      PT LONG TERM GOAL #3   Title  After 8 weeks patient will demonstrate improved tolerance to community distance AMB AEB 6MWT averagiing 1.53m/s or greater.     Time  8    Period  Weeks    Status  New    Target Date  01/25/18      PT LONG TERM GOAL #4   Title  After 8 weeks patient will demonstrate improved single limb stability on unstable surfaces AEB SLS>15sec bilar on airex to improve tolerance to AMB on the beach.     Time  8    Period  Weeks    Status  New    Target Date  01/25/18            Plan - 12/18/17 1020    Clinical Impression Statement  Pt continues to tolerate exercise progression well w/ no complaints of pain. Patient continues to have sufficient passive motion, but needs further PT for strengthening to allow for active motion in functional patterns (ie normalized gait  and stair ambulation)    PT Frequency  2x / week    PT Duration  4 weeks    PT Treatment/Interventions  ADLs/Self Care Home Management;Balance training;Cryotherapy;Electrical Stimulation;Therapeutic  exercise;Therapeutic activities;Functional mobility training;Stair training;Gait training;DME Instruction;Cognitive remediation;Scar mobilization;Compression bandaging;Passive range of motion;Dry needling    PT Next Visit Plan  Strengthening progression, gait/stair training    PT Home Exercise Plan  Mini squats, sidelying hip abd, bridges, heel raises    Consulted and Agree with Plan of Care  Patient       Patient will benefit from skilled therapeutic intervention in order to improve the following deficits and impairments:  Abnormal gait, Hypomobility, Impaired sensation, Decreased knowledge of precautions, Decreased scar mobility, Increased edema, Decreased activity tolerance, Decreased strength, Pain, Decreased balance, Decreased mobility, Difficulty walking, Decreased range of motion, Postural dysfunction  Visit Diagnosis: Stiffness of right knee, not elsewhere classified  Stiffness of left knee, not elsewhere classified  Unsteadiness on feet     Problem List Patient Active Problem List   Diagnosis Date Noted  . OA (osteoarthritis) of knee 04/29/2013  . LOW BACK PAIN 11/21/2010  . VIRAL URI 01/02/2010  . EXTERNAL HEMORRHOIDS 01/20/2008  . Hyperlipemia, mixed 07/23/2007  . Essential hypertension 07/23/2007   Shelton Silvas PT, DPT Shelton Silvas 12/18/2017, 10:54 AM  [facility] [address] [place], [address] Phone: [phone]   Fax:  [phone]  Name: Alicia Mcbride MRN: [id number] Date of Birth: 1951/08/03

## 2017-12-21 ENCOUNTER — Ambulatory Visit: Payer: PPO | Admitting: Physical Therapy

## 2017-12-21 ENCOUNTER — Encounter: Payer: Self-pay | Admitting: Physical Therapy

## 2017-12-21 DIAGNOSIS — M25662 Stiffness of left knee, not elsewhere classified: Secondary | ICD-10-CM

## 2017-12-21 DIAGNOSIS — M25661 Stiffness of right knee, not elsewhere classified: Secondary | ICD-10-CM

## 2017-12-21 NOTE — Therapy (Signed)
[facility] [address], [place], [address] Phone: [phone]   Fax:  [phone]  Physical Therapy Treatment  Patient Details  Name: Alicia Mcbride MRN: [id number] Date of Birth: 10/02/1951 Referring Provider: Gaynelle Arabian   Encounter Date: 12/21/2017  PT End of Session - 12/21/17 1438    Visit Number  7    Number of Visits  18    Date for PT Re-Evaluation  12/31/17    Authorization Type  [employer]    Authorization Time Period  11/30/17-12/29/17    Authorization - Visit Number  2    Authorization - Number of Visits  10    PT Start Time  0230    PT Stop Time  0315    PT Time Calculation (min)  45 min    Equipment Utilized During Treatment  Gait belt    Activity Tolerance  Patient tolerated treatment well;Treatment limited secondary to medical complications (Comment)    Behavior During Therapy  [facility] for tasks assessed/performed       Past Medical History:  Diagnosis Date  . Allergy   . Gynecological examination    sees Dr. Alden Hipp  . Hypertension   . PONV (postoperative nausea and vomiting)     Past Surgical History:  Procedure Laterality Date  . COLONOSCOPY  April 2007   per Dr. Olevia Perches, polyp, repeat in 10 yrs   . TONSILLECTOMY    . 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 TUBAL LIGATION  1985    There were no vitals filed for this visit.  Subjective Assessment - 12/21/17 1433    Subjective  Pt reports no pain, and good compliance with her HEP. Patient reports she is making good progress, but is still struggling with stair ambulation. She reports she has really been focusing on normalizing her gait. No questions or concerns at this time.     Patient is accompained by:  Family member    Pertinent History  No prior history knee/hip surgery.      Limitations  Standing;Walking    How long can you sit comfortably?  not limited    How long can you stand comfortably?  10-15 minutes (showing and dental hygiene)     How long can you walk comfortably?  ~344ft at STR.     Patient Stated Goals  Be able to walk on sand and play on beach with grandkids.             Ther-Ex -Bike 25mins during hx intake -OMEGA hamstring curl 3x 10 15# -OMEGA knee ext 3x 10 15# -Lateral step downs 3x 5each (patient tolerated 5 reps each side before extreme fatigue) -Step ups with back leg floating 3x 10 each 50% to the ground and back up  -Crab walk w/ yellow tband at knees 2x 51ft (5ft down and 32ft back) prolonged rest break needed between d/t increased fatigue  Therapeutic Activites -Stair ambulation with unilateral support patient was able to ascend and descend stairs w/ reciprocal pattern. During first trial patient relied heavily on rail and hiked L hip to lower L LE, but with PT cuing and CGA from PT for confidence patient was able to decrease compensation to normalize stair ambulation   -Ambulation w/o AD 43ft over 4 trials. Patient demonstrates much more normalized gait today with knee flexion around  20d and no toe drag. Patient reports she feels as though her gait is closer to normal, and her foot is not "draggin" but she is not demonstrating adequate knee flexion and hip ext yet, but is trying and notices herself and attempts to correct                   PT Education - 12/21/17 1438    Education provided  Yes    Education Details  Exercise form    Person(s) Educated  Patient    Methods  Explanation;Tactile cues;Verbal cues    Comprehension  Verbalized understanding;Returned demonstration;Tactile cues required;Verbal cues required       PT Short Term Goals - 11/30/17 1727      PT SHORT TERM GOAL #1   Title  After 4 weeks patient will demonstrate improve bilat knee P/ROM to 12-113 degrees bilat to improve tolerance to ADL  performance.     Time  4    Period  Weeks    Status  New    Target Date  12/28/17      PT SHORT TERM GOAL #2   Title  After 4 weeks patient will demonstrate improved functional mobility AEB tolerance of 3 minutes sustained walking s AD, averaging 0.50m/s.     Time  4    Period  Weeks    Status  New    Target Date  12/28/17      PT SHORT TERM GOAL #3   Title  After 4 weeks patient will demonstrate improved functional strength as evidenced by 5xSTS <20sec hands free from chair.     Time  4    Period  Weeks    Status  New    Target Date  12/28/17        PT Long Term Goals - 11/30/17 1733      PT LONG TERM GOAL #1   Title  After 8 weeks patient will demonstrate improved bilat knee ROM <10 degrees to >120 degrees bilat.     Time  8    Period  Weeks    Status  New    Target Date  01/25/18      PT LONG TERM GOAL #2   Title  After 8 weeks patient will demonstrate improved functional strength AEB 5xSTS<11 seconds from standard chair height hands free.     Time  8    Period  Weeks    Status  New    Target Date  01/25/18      PT LONG TERM GOAL #3   Title  After 8 weeks patient will demonstrate improved tolerance to community distance AMB AEB 6MWT averagiing 1.42m/s or greater.     Time  8    Period  Weeks    Status  New    Target Date  01/25/18      PT LONG TERM GOAL #4   Title  After 8 weeks patient will demonstrate improved single limb stability on unstable surfaces AEB SLS>15sec bilar on airex to improve tolerance to AMB on the beach.     Time  8    Period  Weeks    Status  New    Target Date  01/25/18            Plan - 12/21/17 1626    Clinical Impression Statement  Pt continues to tolerate exercise progression w/o pain, but with noted fatigue at the end of sets today. Patient's gait is becoming closer to  normal, as she is working on it at home as well. Patient is demonstrating better push off and foot clearance, but continues to lack functional full knee flexion  and hip ext needed for normal gait pattern. Patient is making improvements in stair ambulation, but continues to have weakness in (especially L ) glute med causing her to compensate with stair descent.    PT Frequency  2x / week    PT Duration  4 weeks    PT Treatment/Interventions  ADLs/Self Care Home Management;Balance training;Cryotherapy;Electrical Stimulation;Therapeutic exercise;Therapeutic activities;Functional mobility training;Stair training;Gait training;DME Instruction;Cognitive remediation;Scar mobilization;Compression bandaging;Passive range of motion;Dry needling    PT Next Visit Plan  Strengthening progression, gait/stair training    PT Home Exercise Plan  Mini squats, sidelying hip abd, bridges, heel raises       Patient will benefit from skilled therapeutic intervention in order to improve the following deficits and impairments:  Abnormal gait, Hypomobility, Impaired sensation, Decreased knowledge of precautions, Decreased scar mobility, Increased edema, Decreased activity tolerance, Decreased strength, Pain, Decreased balance, Decreased mobility, Difficulty walking, Decreased range of motion, Postural dysfunction  Visit Diagnosis: Stiffness of right knee, not elsewhere classified  Stiffness of left knee, not elsewhere classified     Problem List Patient Active Problem List   Diagnosis Date Noted  . OA (osteoarthritis) of knee 04/29/2013  . LOW BACK PAIN 11/21/2010  . VIRAL URI 01/02/2010  . EXTERNAL HEMORRHOIDS 01/20/2008  . Hyperlipemia, mixed 07/23/2007  . Essential hypertension 07/23/2007    Shelton Silvas 12/21/2017, 4:29 PM  [facility] [address] [place], [address] Phone: [phone]   Fax:  [phone]  Name: Alicia Mcbride MRN: [id number] Date of Birth: [birth year]

## 2017-12-23 ENCOUNTER — Encounter: Payer: PPO | Admitting: Physical Therapy

## 2017-12-25 ENCOUNTER — Ambulatory Visit: Payer: PPO | Admitting: Physical Therapy

## 2017-12-25 DIAGNOSIS — M25661 Stiffness of right knee, not elsewhere classified: Secondary | ICD-10-CM | POA: Diagnosis not present

## 2017-12-25 NOTE — Therapy (Signed)
[facility] [address], [place], [address] Phone: [phone]   Fax:  [phone]  Physical Therapy Treatment  Patient Details  Name: Alicia Mcbride MRN: [id number] Date of Birth: Apr 08, 1951 Referring Provider: Gaynelle Arabian   Encounter Date: 12/25/2017    Past Medical History:  Diagnosis Date  . Allergy   . Gynecological examination    sees Dr. Alden Hipp  . Hypertension   . PONV (postoperative nausea and vomiting)     Past Surgical History:  Procedure Laterality Date  . COLONOSCOPY  April 2007   per Dr. Olevia Perches, polyp, repeat in 10 yrs   . TONSILLECTOMY    . 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 TUBAL LIGATION  1985    There were no vitals filed for this visit.  Subjective Assessment - 12/25/17 1010    Subjective  Pt reports no pain, and good comopliance with HEP. She reports she walked through the grocery store with her cane yesterday for the first time, with no pain or increased soreness. Patient reports she has still been trying to normalize gait and that her stair ambulation is "getting better.     Pertinent History  No prior history knee/hip surgery.     Limitations  Standing;Walking    How long can you sit comfortably?  not limited    How long can you stand comfortably?  10-15 minutes (showing and dental hygiene)     How long can you walk comfortably?  ~363ft at STR.     Patient Stated Goals  Be able to walk on sand and play on beach with grandkids.     Currently in Pain?  No/denies    Pain Location  Knee    Pain Orientation  Left;Right         Ther-Ex -Bike 36mins during hx intake (not billed) -Mini squats w/ red tband 3x 10 with cuing for hip ext activation -Crab Walks 14ft w/ red tband 3x down and back in mini squat position -Lateral step  downs 3x 10 each 50% ROM with cuing for proper form -OMEGA leg press SINGLE leg 10# 1x 10 > 15# each leg -SLS bridge (patient unable to complete so discontinued) -Prone hip ext w/ 2# ankle wt 3x 10 each LE -Patient walked around the gym with noted more normalized gait able was able to self correct for more toe push off and knee flex                        PT Short Term Goals - 11/30/17 1727      PT SHORT TERM GOAL #1   Title  After 4 weeks patient will demonstrate improve bilat knee P/ROM to 12-113 degrees bilat to improve tolerance to ADL performance.     Time  4    Period  Weeks    Status  New    Target Date  12/28/17      PT SHORT TERM GOAL #2   Title  After 4 weeks patient will demonstrate improved functional mobility AEB tolerance of 3 minutes sustained walking s AD, averaging 0.53m/s.     Time  4    Period  Weeks    Status  New    Target Date  12/28/17      PT SHORT TERM GOAL #3  Title  After 4 weeks patient will demonstrate improved functional strength as evidenced by 5xSTS <20sec hands free from chair.     Time  4    Period  Weeks    Status  New    Target Date  12/28/17        PT Long Term Goals - 11/30/17 1733      PT LONG TERM GOAL #1   Title  After 8 weeks patient will demonstrate improved bilat knee ROM <10 degrees to >120 degrees bilat.     Time  8    Period  Weeks    Status  New    Target Date  01/25/18      PT LONG TERM GOAL #2   Title  After 8 weeks patient will demonstrate improved functional strength AEB 5xSTS<11 seconds from standard chair height hands free.     Time  8    Period  Weeks    Status  New    Target Date  01/25/18      PT LONG TERM GOAL #3   Title  After 8 weeks patient will demonstrate improved tolerance to community distance AMB AEB 6MWT averagiing 1.45m/s or greater.     Time  8    Period  Weeks    Status  New    Target Date  01/25/18      PT LONG TERM GOAL #4   Title  After 8 weeks patient will  demonstrate improved single limb stability on unstable surfaces AEB SLS>15sec bilar on airex to improve tolerance to AMB on the beach.     Time  8    Period  Weeks    Status  New    Target Date  01/25/18              Patient will benefit from skilled therapeutic intervention in order to improve the following deficits and impairments:     Visit Diagnosis: No diagnosis found.     Problem List Patient Active Problem List   Diagnosis Date Noted  . OA (osteoarthritis) of knee 04/29/2013  . LOW BACK PAIN 11/21/2010  . VIRAL URI 01/02/2010  . EXTERNAL HEMORRHOIDS 01/20/2008  . Hyperlipemia, mixed 07/23/2007  . Essential hypertension 07/23/2007   Shelton Silvas PT, DPT Shelton Silvas 12/25/2017, 10:13 AM  [facility] [address], [place], [address] Phone: [phone]   Fax:  [phone]  Name: IVANNIA WILLHELM MRN: [id number] Date of Birth: 30-Jul-1951

## 2017-12-28 ENCOUNTER — Ambulatory Visit: Payer: PPO | Admitting: Physical Therapy

## 2017-12-28 ENCOUNTER — Encounter: Payer: Self-pay | Admitting: Physical Therapy

## 2017-12-28 DIAGNOSIS — M25662 Stiffness of left knee, not elsewhere classified: Secondary | ICD-10-CM

## 2017-12-28 DIAGNOSIS — M25661 Stiffness of right knee, not elsewhere classified: Secondary | ICD-10-CM | POA: Diagnosis not present

## 2017-12-28 NOTE — Therapy (Signed)
[facility] [address], [place], [address] Phone: [phone]   Fax:  [phone]  Physical Therapy Treatment  Patient Details  Name: Alicia Mcbride MRN: [id number] Date of Birth: 1951/03/06 Referring Provider: Gaynelle Arabian   Encounter Date: 12/28/2017  PT End of Session - 12/28/17 1357    Visit Number  9    Number of Visits  18    Date for PT Re-Evaluation  12/31/17    Authorization Type  [employer]    Authorization Time Period  11/30/17-12/29/17    PT Start Time  0145    PT Stop Time  0230    PT Time Calculation (min)  45 min    Activity Tolerance  Patient tolerated treatment well    Behavior During Therapy  [facility] for tasks assessed/performed       Past Medical History:  Diagnosis Date  . Allergy   . Gynecological examination    sees Dr. Alden Hipp  . Hypertension   . PONV (postoperative nausea and vomiting)     Past Surgical History:  Procedure Laterality Date  . COLONOSCOPY  April 2007   per Dr. Olevia Perches, polyp, repeat in 10 yrs   . TONSILLECTOMY    . 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 TUBAL LIGATION  1985    There were no vitals filed for this visit.  Subjective Assessment - 12/28/17 1346    Subjective  Pt continues to report no pain other than once during the night that woke her up, then went away. She reports minimal soreness and no pain following last session. Patient reports she is continuing to work on walking and stairs, but she feels she is "pulling" herself up her steps    Patient is accompained by:  Family member    Pertinent History  No prior history knee/hip surgery.     Limitations  Standing;Walking    How long can you sit comfortably?  not limited    How long can you stand comfortably?  10-15 minutes (showing and dental  hygiene)     How long can you walk comfortably?  ~375f at STR.     Patient Stated Goals  Be able to walk on sand and play on beach with grandkids.           Ther-Ex  -Bike 429ms during hx intake (not billed) -Mini squats w/ red tband 3x 15 with cuing for hip ext activation no HH -Crab Walks 2075f/ red tband 3x down and back in mini squat position -STS exercise w/o hands 2x 5 w/ cuing for full hip ext -Mini squat + towel slide hip abd maintaining mini squat throughout exercise 4x 12 -OMEGA leg press 10# 2x 12 each LE  Gait Training -Patient walked around the gym with noted more normalized gait able was able to self correct for more toe push off and knee flex. Patient completed 6MWT for walking endurance test as well with normalized gait                     PT Education - 12/28/17 1356    Education provided  Yes    Education Details  Exercise Form    Person(s) Educated  Patient    Methods  Explanation;Demonstration;Verbal cues    Comprehension  Verbalized understanding;Returned demonstration  PT Short Term Goals - 11/30/17 1727      PT SHORT TERM GOAL #1   Title  After 4 weeks patient will demonstrate improve bilat knee P/ROM to 12-113 degrees bilat to improve tolerance to ADL performance.     Time  4    Period  Weeks    Status  New    Target Date  12/28/17      PT SHORT TERM GOAL #2   Title  After 4 weeks patient will demonstrate improved functional mobility AEB tolerance of 3 minutes sustained walking s AD, averaging 0.54ms.     Time  4    Period  Weeks    Status  New    Target Date  12/28/17      PT SHORT TERM GOAL #3   Title  After 4 weeks patient will demonstrate improved functional strength as evidenced by 5xSTS <20sec hands free from chair.     Time  4    Period  Weeks    Status  New    Target Date  12/28/17        PT Long Term Goals - 12/28/17 1402      PT LONG TERM GOAL #1   Title  After 8 weeks patient will demonstrate  improved bilat knee ROM <10 degrees to >120 degrees bilat.     Time  8    Status  Achieved      PT LONG TERM GOAL #2   Title  After 8 weeks patient will demonstrate improved functional strength AEB 5xSTS<11 seconds from standard chair height hands free.     Baseline  2/18 21sec hands free    Time  2    Period  Days    Status  On-going      PT LONG TERM GOAL #3   Title  After 8 weeks patient will demonstrate improved tolerance to community distance AMB AEB 6MWT averagiing 1.190m or greater.     Baseline  [id number]/50m   Status  Partially Met      PT LONG TERM GOAL #4   Baseline  12/28/17 L:v4sec R: 7sec    Status  On-going            Plan - 12/28/17 1831    Clinical Impression Statement  Pt continues to tolerate exercise progression well, and is demonstrating increased walking endurance and gait speed. Patient's goals were reassessed today as she needed recertification for insurance. She has meet most of her goals at this time, but is just shy from normal gait speed for her age, and coontinues to have strength deficits (esp in hip ext and abd) that are disallowing her from feeling confedient with SLS stance activities. The lack of hip strength and decreased SLS is continuing to make stair ambulation difficult. Patient reports that stair ambulation is the only task she struggles with to date. Pt will continue to benefit from skilled PT to encourage correct stair negotiation in a safe environment, and to increase hip strength needed for SLS stance activities (including normalized gait, and stair negotiation). PT will see patient 1x/ week for 2 more weeks to continue these tasks and give patient more opportunities to enhanve HEP for hip strengthening.     History and Personal Factors relevant to plan of care:  it speed. Patient's     PT Treatment/Interventions  ADLs/Self Care Home Management;Balance training;Cryotherapy;Electrical Stimulation;Therapeutic exercise;Therapeutic  activities;Functional mobility training;Stair training;Gait training;DME Instruction;Cognitive remediation;Scar mobilization;Compression bandaging;Passive range of motion;Dry  needling    PT Next Visit Plan  Strengthening progression, gait/stair training    PT Home Exercise Plan  Mini squats, sidelying hip abd, bridges, heel raises, crab walks w/ tband    Consulted and Agree with Plan of Care  Patient       Patient will benefit from skilled therapeutic intervention in order to improve the following deficits and impairments:  Abnormal gait, Hypomobility, Impaired sensation, Decreased knowledge of precautions, Decreased scar mobility, Increased edema, Decreased activity tolerance, Decreased strength, Pain, Decreased balance, Decreased mobility, Difficulty walking, Decreased range of motion, Postural dysfunction  Visit Diagnosis: Stiffness of right knee, not elsewhere classified  Stiffness of left knee, not elsewhere classified     Problem List Patient Active Problem List   Diagnosis Date Noted  . OA (osteoarthritis) of knee 04/29/2013  . LOW BACK PAIN 11/21/2010  . VIRAL URI 01/02/2010  . EXTERNAL HEMORRHOIDS 01/20/2008  . Hyperlipemia, mixed 07/23/2007  . Essential hypertension 07/23/2007    Shelton Silvas PT, DPT Shelton Silvas 12/28/2017, 6:38 PM  [facility] [address], [place], [address] Phone: [phone]   Fax:  [phone]  Name: Alicia Mcbride MRN: [id number] Date of Birth: [birth year]

## 2017-12-30 ENCOUNTER — Encounter: Payer: PPO | Admitting: Physical Therapy

## 2018-01-01 ENCOUNTER — Ambulatory Visit: Payer: PPO | Admitting: Physical Therapy

## 2018-01-03 ENCOUNTER — Other Ambulatory Visit: Payer: Self-pay

## 2018-01-03 ENCOUNTER — Observation Stay
Admission: EM | Admit: 2018-01-03 | Discharge: 2018-01-05 | Disposition: A | Discharge status: Home / Self Care | Payer: PPO | Attending: Surgery | Admitting: Surgery

## 2018-01-03 ENCOUNTER — Emergency Department: Payer: PPO

## 2018-01-03 DIAGNOSIS — Z79899 Other long term (current) drug therapy: Secondary | ICD-10-CM | POA: Diagnosis not present

## 2018-01-03 DIAGNOSIS — I10 Essential (primary) hypertension: Secondary | ICD-10-CM | POA: Diagnosis not present

## 2018-01-03 DIAGNOSIS — R112 Nausea with vomiting, unspecified: Secondary | ICD-10-CM | POA: Diagnosis not present

## 2018-01-03 DIAGNOSIS — K8050 Calculus of bile duct without cholangitis or cholecystitis without obstruction: Secondary | ICD-10-CM

## 2018-01-03 DIAGNOSIS — K810 Acute cholecystitis: Secondary | ICD-10-CM | POA: Diagnosis present

## 2018-01-03 DIAGNOSIS — K8020 Calculus of gallbladder without cholecystitis without obstruction: Secondary | ICD-10-CM | POA: Diagnosis not present

## 2018-01-03 DIAGNOSIS — R109 Unspecified abdominal pain: Secondary | ICD-10-CM

## 2018-01-03 DIAGNOSIS — K819 Cholecystitis, unspecified: Secondary | ICD-10-CM

## 2018-01-03 DIAGNOSIS — N390 Urinary tract infection, site not specified: Secondary | ICD-10-CM | POA: Diagnosis not present

## 2018-01-03 DIAGNOSIS — R079 Chest pain, unspecified: Secondary | ICD-10-CM | POA: Diagnosis not present

## 2018-01-03 DIAGNOSIS — R1110 Vomiting, unspecified: Secondary | ICD-10-CM | POA: Diagnosis not present

## 2018-01-03 DIAGNOSIS — R1084 Generalized abdominal pain: Secondary | ICD-10-CM | POA: Diagnosis not present

## 2018-01-03 DIAGNOSIS — K8042 Calculus of bile duct with acute cholecystitis without obstruction: Secondary | ICD-10-CM | POA: Diagnosis not present

## 2018-01-03 DIAGNOSIS — K8000 Calculus of gallbladder with acute cholecystitis without obstruction: Principal | ICD-10-CM | POA: Insufficient documentation

## 2018-01-03 LAB — HEPATIC FUNCTION PANEL
ALT: 15 U/L (ref 14–54)
AST: 30 U/L (ref 15–41)
Albumin: 4.3 g/dL (ref 3.5–5.0)
Alkaline Phosphatase: 80 U/L (ref 38–126)
BILIRUBIN DIRECT: 0.2 mg/dL (ref 0.1–0.5)
BILIRUBIN TOTAL: 0.9 mg/dL (ref 0.3–1.2)
Indirect Bilirubin: 0.7 mg/dL (ref 0.3–0.9)
Total Protein: 7.5 g/dL (ref 6.5–8.1)

## 2018-01-03 LAB — BASIC METABOLIC PANEL
ANION GAP: 15 (ref 5–15)
BUN: 21 mg/dL — ABNORMAL HIGH (ref 6–20)
CHLORIDE: 101 mmol/L (ref 101–111)
CO2: 22 mmol/L (ref 22–32)
Calcium: 9.4 mg/dL (ref 8.9–10.3)
Creatinine, Ser: 0.57 mg/dL (ref 0.44–1.00)
GFR calc non Af Amer: >60 mL/min (ref >60)
GLUCOSE: 143 mg/dL — AB (ref 65–99)
POTASSIUM: 3.3 mmol/L — AB (ref 3.5–5.1)
Sodium: 138 mmol/L (ref 135–145)

## 2018-01-03 LAB — CBC
HEMATOCRIT: 39.9 % (ref 35.0–47.0)
HEMOGLOBIN: 13.1 g/dL (ref 12.0–16.0)
MCH: 27.6 pg (ref 26.0–34.0)
MCHC: 32.9 g/dL (ref 32.0–36.0)
MCV: 83.7 fL (ref 80.0–100.0)
Platelets: 365 10*3/uL (ref 150–440)
RBC: 4.76 MIL/uL (ref 3.80–5.20)
RDW: 16 % — AB (ref 11.5–14.5)
WBC: 9.2 10*3/uL (ref 3.6–11.0)

## 2018-01-03 LAB — URINALYSIS, COMPLETE (UACMP) WITH MICROSCOPIC
Bilirubin Urine: NEGATIVE
GLUCOSE, UA: NEGATIVE mg/dL
KETONES UR: 20 mg/dL — AB
Nitrite: NEGATIVE
PH: 6 (ref 5.0–8.0)
Protein, ur: 30 mg/dL — AB
SPECIFIC GRAVITY, URINE: 1.02 (ref 1.005–1.030)

## 2018-01-03 LAB — LIPASE, BLOOD: Lipase: 36 U/L (ref 11–51)

## 2018-01-03 LAB — TROPONIN I: Troponin I: <0.03 ng/mL (ref <0.03)

## 2018-01-03 MED ORDER — IOPAMIDOL (ISOVUE-300) INJECTION 61%
100.0000 mL | Freq: Once | INTRAVENOUS | Status: AC | PRN
  Administered 2018-01-03: 100 mL via INTRAVENOUS

## 2018-01-03 MED ORDER — MORPHINE SULFATE (PF) 4 MG/ML IV SOLN
INTRAVENOUS | Status: AC
  Administered 2018-01-03: 4 mg
  Filled 2018-01-03: qty 1

## 2018-01-03 MED ORDER — ONDANSETRON HCL 4 MG/2ML IJ SOLN
4.0000 mg | Freq: Once | INTRAMUSCULAR | Status: AC
  Administered 2018-01-03 (×2): 4 mg via INTRAVENOUS
  Filled 2018-01-03: qty 2

## 2018-01-03 MED ORDER — HYDROMORPHONE HCL 1 MG/ML IJ SOLN
0.5000 mg | Freq: Once | INTRAMUSCULAR | Status: AC
  Administered 2018-01-03: 0.5 mg via INTRAVENOUS
  Filled 2018-01-03: qty 1

## 2018-01-03 MED ORDER — ONDANSETRON HCL 4 MG/2ML IJ SOLN
INTRAMUSCULAR | Status: AC
  Administered 2018-01-03: 4 mg via INTRAVENOUS
  Filled 2018-01-03: qty 2

## 2018-01-03 MED ORDER — IOPAMIDOL (ISOVUE-300) INJECTION 61%
30.0000 mL | Freq: Once | INTRAVENOUS | Status: AC | PRN
  Administered 2018-01-03: 30 mL via ORAL

## 2018-01-03 NOTE — ED Provider Notes (Signed)
-----------------------------------------   11:17 PM on 01/03/2018 -----------------------------------------  Assuming care from Dr. Cherylann Banas.  In short, Alicia Mcbride is a 67 y.o. female with a chief complaint of abdominal pain.Marland Kitchen  Refer to the original H&P for additional details.  The current plan of care is to follow up CT scan.  Patient will need treatment for UTI/pyelo.   ----------------------------------------- 11:40 PM on 01/03/2018 -----------------------------------------  CT scan is generally unremarkable but does demonstrate that the patient has cholelithiasis without evidence of cholecystitis.  Dr. Cherylann Banas is still here and we discussed the case and he would like to proceed with an ultrasound to make sure that there is no evidence of a stone stuck in the neck of the gallbladder, for example, or evidence of choledocholithiasis.   ----------------------------------------- 1:44 AM on 01/04/2018 -----------------------------------------  Large 12 mm and mobile stone in the gallbladder neck with significantly thickened gallbladder wall.  Patient feels better after Toradol but still has persistent pain and is still tender to palpation.  I updated her about the biliary colic and nonmobile stone.  Spoke with Dr. Adonis Huguenin who will come to the ED to evaluate the patient in person.   ----------------------------------------- 2:28 AM on 01/04/2018 -----------------------------------------  Discussed the case in person with Dr. Adonis Huguenin after he evaluated the patient.  He is going to admit her for acute cholecystitis.  No evidence of choledocholithiasis based on the ultrasound and lab results.    Final diagnoses:  Urinary tract infection without hematuria, site unspecified  Abdominal pain, unspecified abdominal location  Biliary colic  Acute cholecystitis     Medications  ondansetron (ZOFRAN) 4 MG/2ML injection (not administered)  ketorolac (TORADOL) 30 MG/ML injection (not  administered)  sodium chloride 0.9 % bolus 1,000 mL (not administered)  ondansetron (ZOFRAN) 4 MG/2ML injection (4 mg  Given 01/03/18 2124)  morphine 4 MG/ML injection (4 mg  Given 01/03/18 2126)  HYDROmorphone (DILAUDID) injection 0.5 mg (0.5 mg Intravenous Given 01/03/18 2239)  ondansetron (ZOFRAN) injection 4 mg (4 mg Intravenous Given 01/03/18 2233)  iopamidol (ISOVUE-300) 61 % injection 30 mL (30 mLs Oral Contrast Given 01/03/18 2216)  iopamidol (ISOVUE-300) 61 % injection 100 mL (100 mLs Intravenous Contrast Given 01/03/18 2307)  ketorolac (TORADOL) 30 MG/ML injection 15 mg (15 mg Intravenous Given 01/04/18 0100)  ondansetron (ZOFRAN) injection 4 mg (4 mg Intravenous Given 01/04/18 0101)      Hinda Kehr, MD 01/04/18 [phone]

## 2018-01-03 NOTE — ED Provider Notes (Addendum)
[facility] Emergency Department Provider Note ____________________________________________   First MD Initiated Contact with Patient 01/03/18 2156     (approximate)  I have reviewed the triage vital signs and the nursing notes.   HISTORY  Chief Complaint Chest Pain    HPI Alicia Mcbride is a 67 y.o. female with past medical history as noted below who presents with diffuse central abdominal pain, acute onset approximately 6 hours ago, associated with multiple episodes of vomiting as well as persistent nausea, but not associated with diarrhea.  Patient states the pain began in the epigastric region, and now moved into the central abdomen.  It is not worse on either side.  She states her last bowel movement was yesterday, and was normal.  She denies fever, urinary symptoms, or chest pain.  She denies any difficulty breathing.  No prior history of this pain.   Past Medical History:  Diagnosis Date  . Allergy   . Gynecological examination    sees Dr. Alden Hipp  . Hypertension   . PONV (postoperative nausea and vomiting)     Patient Active Problem List   Diagnosis Date Noted  . Cholecystitis 01/05/2018  . Acute cholecystitis 01/04/2018  . OA (osteoarthritis) of knee 04/29/2013  . LOW BACK PAIN 11/21/2010  . VIRAL URI 01/02/2010  . EXTERNAL HEMORRHOIDS 01/20/2008  . Hyperlipemia, mixed 07/23/2007  . Essential hypertension 07/23/2007    Past Surgical History:  Procedure Laterality Date  . CHOLECYSTECTOMY N/A 01/04/2018   Procedure: LAPAROSCOPIC CHOLECYSTECTOMY;  Surgeon: Olean Ree, MD;  Location: [facility] ORS;  Service: General;  Laterality: N/A;  . COLONOSCOPY  April 2007   per Dr. Olevia Perches, polyp, repeat in 10 yrs   . TONSILLECTOMY    . 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 TUBAL LIGATION  1985     Prior to Admission medications   Medication Sig Start Date End Date Taking? Authorizing Provider  Ibuprofen-Diphenhydramine Cit (ADVIL PM PO) Take 2 tablets by mouth at bedtime.   Yes [provider]  losartan-hydrochlorothiazide (HYZAAR) 100-25 MG tablet Take 1 tablet by mouth daily. 06/24/17  Yes Laurey Morale, MD  HYDROcodone-acetaminophen (NORCO/VICODIN) 5-325 MG tablet Take 1-2 tablets by mouth every 6 (six) hours as needed for severe pain. 01/05/18   Olean Ree, MD  ibuprofen (ADVIL,MOTRIN) 600 MG tablet Take 1 tablet (600 mg total) by mouth every 8 (eight) hours as needed for fever, headache, mild pain or moderate pain. 01/05/18   Olean Ree, MD    Allergies Patient has no known allergies.  Family History  Problem Relation Age of Onset  . Hypertension Mother   . Thyroid disease Father     Social History Social History   Tobacco Use  . Smoking status: Never Smoker  . Smokeless tobacco: Never Used  Substance Use Topics  . Alcohol use: Yes    Alcohol/week: 4.8 oz    Types: 8 Glasses of wine per week    Comment: couple of glasses of wine each day  . Drug use: No    Review of Systems  Constitutional: No fever. Eyes: No redness. ENT: No neck pain. Cardiovascular: Denies chest pain. Respiratory: Denies shortness of breath. Gastrointestinal: Positive for nausea and vomiting.  Genitourinary: Negative for dysuria.  Musculoskeletal: Negative for back pain. Skin: Negative for rash. Neurological: Negative for headache.   ____________________________________________   PHYSICAL EXAM:  VITAL SIGNS: ED Triage Vitals  Enc Vitals Group     BP 01/03/18 2101 (!) 145/101     Pulse Rate 01/03/18 2101 76     Resp 01/03/18 2101 20     Temp 01/03/18 2101 97.8 F (36.6 C)     Temp src --      SpO2 01/03/18 2101 100 %     Weight 01/03/18 2100 181 lb (82.1 kg)     Height 01/03/18 2100 5\' 8"  (1.727 m)     Head Circumference --      Peak Flow --      Pain  Score 01/03/18 2100 10     Pain Loc --      Pain Edu? --      Excl. in [place]? --     Constitutional: Alert and oriented.  Uncomfortable appearing but in no acute distress. Eyes: Conjunctivae are normal.  No scleral icterus. Head: Atraumatic. Nose: No congestion/rhinnorhea. Mouth/Throat: Mucous membranes are slightly dry.   Neck: Normal range of motion.  Cardiovascular: Normal rate, regular rhythm. Grossly normal heart sounds.  Good peripheral circulation. Respiratory: Normal respiratory effort.  No retractions. Lungs CTAB. Gastrointestinal: Soft with mild diffuse tenderness. No distention.  Genitourinary: No CVA tenderness. Musculoskeletal:  Extremities warm and well perfused.  Neurologic:  Normal speech and language. No gross focal neurologic deficits are appreciated.  Skin:  Skin is warm and dry. No rash noted. Psychiatric: Mood and affect are normal. Speech and behavior are normal.  ____________________________________________   LABS (all labs ordered are listed, but only abnormal results are displayed)  Labs Reviewed  URINE CULTURE - Abnormal; Notable for the following components:      Result Value   Culture MULTIPLE SPECIES PRESENT, SUGGEST RECOLLECTION (*)    All other components within normal limits  BASIC METABOLIC PANEL - Abnormal; Notable for the following components:   Potassium 3.3 (*)    Glucose, Bld 143 (*)    BUN 21 (*)    All other components within normal limits  CBC - Abnormal; Notable for the following components:   RDW 16.0 (*)    All other components within normal limits  URINALYSIS, COMPLETE (UACMP) WITH MICROSCOPIC - Abnormal; Notable for the following components:   Color, Urine YELLOW (*)    APPearance HAZY (*)    Hgb urine dipstick MODERATE (*)    Ketones, ur 20 (*)    Protein, ur 30 (*)    Leukocytes, UA MODERATE (*)    Bacteria, UA RARE (*)    Squamous Epithelial / LPF 0-5 (*)    All other components within normal limits  COMPREHENSIVE  METABOLIC PANEL - Abnormal; Notable for the following components:   Glucose, Bld 158 (*)    Calcium 8.8 (*)    All other components within normal limits  CBC - Abnormal; Notable for the following components:   Hemoglobin 11.6 (*)    HCT 34.7 (*)    RDW 15.3 (*)    All other components within normal limits  SURGICAL PCR SCREEN  TROPONIN I  HEPATIC FUNCTION PANEL  LIPASE, BLOOD  HIV ANTIBODY (ROUTINE TESTING)  SURGICAL PATHOLOGY   ____________________________________________  EKG  ED ECG REPORT I, Arta Silence, the attending physician, personally viewed and interpreted this ECG.  Date: 01/03/2018 EKG Time: 2057 Rate: 65 Rhythm: normal sinus rhythm QRS Axis: normal Intervals: normal ST/T Wave abnormalities: Nonspecific anterior T wave inversions Narrative Interpretation: no evidence of acute ischemia; no significant change when compared to EKG  of 10/27/2017  ____________________________________________  RADIOLOGY  CXR: No focal infiltrate or other acute findings CT abdomen: Pending  ____________________________________________   PROCEDURES  Procedure(s) performed: No  Procedures  Critical Care performed: No ____________________________________________   INITIAL IMPRESSION / ASSESSMENT AND PLAN / ED COURSE  Pertinent labs & imaging results that were available during my care of the patient were reviewed by me and considered in my medical decision making (see chart for details).  67 year old female with past medical history as noted above presents with initially epigastric and now central abdominal pain for the last several hours, associated with nausea and several episodes of vomiting.  No associated diarrhea or urinary symptoms.  Past medical records reviewed in Epic and are noncontributory.  The patient had bilateral total knee arthroplasty last month.  On exam, the patient is uncomfortable but not acutely ill-appearing, the abdomen is soft but with  mild diffuse tenderness, and the remainder the exam is unremarkable.  Initial lab workup is unrevealing.  Patient's UA is abnormal but patient has no symptoms of UTI.  Differential is broad and includes gastritis, PUD, hepatobiliary cause, colitis, diverticulitis, or less likely SBO.  It is possible that the symptoms could be related to pyelonephritis but this is less likely.  Plan: CT abdomen, analgesia, and reassess.    ----------------------------------------- 11:20 PM on 01/03/2018 -----------------------------------------  Patient's pain is improved after the Dilaudid.  She had one episode of vomiting after the contrast.  She is pending CT.  I am signing the patient out to the oncoming physician Dr. Karma Greaser.  If CT shows no acute findings, then patient will need treatment for UTI/possible pyelonephritis.  Disposition will be based on patient's clinical status and whether her pain is adequately controlled and she can tolerate p.o.  If patient has persistent severe pain or is unable to tolerate p.o., she will need admission.   ____________________________________________   FINAL CLINICAL IMPRESSION(S) / ED DIAGNOSES  Final diagnoses:  Urinary tract infection without hematuria, site unspecified  Abdominal pain, unspecified abdominal location  Biliary colic  Acute cholecystitis      NEW MEDICATIONS STARTED DURING THIS VISIT:  Discharge Medication List as of 01/05/2018 10:01 AM    START taking these medications   Details  HYDROcodone-acetaminophen (NORCO/VICODIN) 5-325 MG tablet Take 1-2 tablets by mouth every 6 (six) hours as needed for severe pain., Starting Tue 01/05/2018, Normal    ibuprofen (ADVIL,MOTRIN) 600 MG tablet Take 1 tablet (600 mg total) by mouth every 8 (eight) hours as needed for fever, headache, mild pain or moderate pain., Starting Tue 01/05/2018, Normal         Note:  This document was prepared using Dragon voice recognition software and may include  unintentional dictation errors.    Arta Silence, MD 01/03/18 5374    Arta Silence, MD 01/05/18 2212

## 2018-01-03 NOTE — ED Notes (Signed)
Report given to Gwen Her RN

## 2018-01-03 NOTE — ED Notes (Signed)
Patient is drinking oral contrast at this time.

## 2018-01-03 NOTE — ED Notes (Signed)
Patient vomited after drinking second bottle of oral contrast. Dr. Cherylann Banas aware and said patient could go to CT scan at this time. CT scan tech aware.

## 2018-01-03 NOTE — ED Triage Notes (Signed)
Patient presents with CP/epigastric pain with active vomiting. Total knee replacement in January 2019. Denies [facility]

## 2018-01-04 ENCOUNTER — Inpatient Hospital Stay: Payer: PPO | Admitting: Anesthesiology

## 2018-01-04 ENCOUNTER — Other Ambulatory Visit: Payer: Self-pay

## 2018-01-04 ENCOUNTER — Ambulatory Visit: Admit: 2018-01-04 | Payer: PPO | Admitting: Surgery

## 2018-01-04 ENCOUNTER — Ambulatory Visit: Payer: PPO | Admitting: Physical Therapy

## 2018-01-04 ENCOUNTER — Emergency Department: Payer: PPO

## 2018-01-04 ENCOUNTER — Encounter: Payer: Self-pay | Admitting: *Deleted

## 2018-01-04 ENCOUNTER — Encounter
Admission: EM | Disposition: A | Discharge status: Home / Self Care | Payer: Self-pay | Source: Home / Self Care | Attending: Emergency Medicine

## 2018-01-04 DIAGNOSIS — K810 Acute cholecystitis: Secondary | ICD-10-CM | POA: Diagnosis not present

## 2018-01-04 DIAGNOSIS — K829 Disease of gallbladder, unspecified: Secondary | ICD-10-CM | POA: Diagnosis not present

## 2018-01-04 DIAGNOSIS — K8020 Calculus of gallbladder without cholecystitis without obstruction: Secondary | ICD-10-CM | POA: Diagnosis not present

## 2018-01-04 DIAGNOSIS — K8000 Calculus of gallbladder with acute cholecystitis without obstruction: Secondary | ICD-10-CM | POA: Diagnosis not present

## 2018-01-04 HISTORY — PX: CHOLECYSTECTOMY: SHX55

## 2018-01-04 HISTORY — DX: Acute cholecystitis: K81.0

## 2018-01-04 LAB — CBC
HEMATOCRIT: 34.7 % — AB (ref 35.0–47.0)
Hemoglobin: 11.6 g/dL — ABNORMAL LOW (ref 12.0–16.0)
MCH: 27.7 pg (ref 26.0–34.0)
MCHC: 33.4 g/dL (ref 32.0–36.0)
MCV: 83.1 fL (ref 80.0–100.0)
PLATELETS: 316 10*3/uL (ref 150–440)
RBC: 4.18 MIL/uL (ref 3.80–5.20)
RDW: 15.3 % — AB (ref 11.5–14.5)
WBC: 9.5 10*3/uL (ref 3.6–11.0)

## 2018-01-04 LAB — COMPREHENSIVE METABOLIC PANEL
ALT: 14 U/L (ref 14–54)
ANION GAP: 10 (ref 5–15)
AST: 19 U/L (ref 15–41)
Albumin: 3.9 g/dL (ref 3.5–5.0)
Alkaline Phosphatase: 66 U/L (ref 38–126)
BILIRUBIN TOTAL: 0.8 mg/dL (ref 0.3–1.2)
BUN: 19 mg/dL (ref 6–20)
CO2: 25 mmol/L (ref 22–32)
Calcium: 8.8 mg/dL — ABNORMAL LOW (ref 8.9–10.3)
Chloride: 102 mmol/L (ref 101–111)
Creatinine, Ser: 0.58 mg/dL (ref 0.44–1.00)
Glucose, Bld: 158 mg/dL — ABNORMAL HIGH (ref 65–99)
POTASSIUM: 3.5 mmol/L (ref 3.5–5.1)
Sodium: 137 mmol/L (ref 135–145)
TOTAL PROTEIN: 6.5 g/dL (ref 6.5–8.1)

## 2018-01-04 LAB — SURGICAL PCR SCREEN
MRSA, PCR: NEGATIVE
STAPHYLOCOCCUS AUREUS: NEGATIVE

## 2018-01-04 SURGERY — LAPAROSCOPIC CHOLECYSTECTOMY
Anesthesia: General | Site: Abdomen | Wound class: Clean Contaminated

## 2018-01-04 MED ORDER — KETOROLAC TROMETHAMINE 30 MG/ML IJ SOLN
INTRAMUSCULAR | Status: AC
  Administered 2018-01-04: 15 mg via INTRAVENOUS
  Filled 2018-01-04: qty 1

## 2018-01-04 MED ORDER — SODIUM CHLORIDE 0.9 % IV BOLUS (SEPSIS): 1000.0000 mL | INTRAVENOUS | Status: DC

## 2018-01-04 MED ORDER — ACETAMINOPHEN 10 MG/ML IV SOLN
INTRAVENOUS | Status: DC | PRN
  Administered 2018-01-04: 1000 mg via INTRAVENOUS

## 2018-01-04 MED ORDER — ACETAMINOPHEN 10 MG/ML IV SOLN
INTRAVENOUS | Status: AC
  Filled 2018-01-04: qty 100

## 2018-01-04 MED ORDER — PROPOFOL 10 MG/ML IV BOLUS
INTRAVENOUS | Status: AC
  Filled 2018-01-04: qty 20

## 2018-01-04 MED ORDER — KCL IN DEXTROSE-NACL 20-5-0.45 MEQ/L-%-% IV SOLN
INTRAVENOUS | Status: DC
  Administered 2018-01-04 (×3): via INTRAVENOUS
  Filled 2018-01-04 (×6): qty 1000

## 2018-01-04 MED ORDER — ONDANSETRON 4 MG PO TBDP: 4.0000 mg | ORAL_TABLET | Freq: Four times a day (QID) | ORAL | Status: DC | PRN

## 2018-01-04 MED ORDER — KETOROLAC TROMETHAMINE 30 MG/ML IJ SOLN
INTRAMUSCULAR | Status: DC | PRN
  Administered 2018-01-04: 30 mg via INTRAVENOUS

## 2018-01-04 MED ORDER — ROCURONIUM BROMIDE 50 MG/5ML IV SOLN
INTRAVENOUS | Status: AC
  Filled 2018-01-04: qty 1

## 2018-01-04 MED ORDER — SEVOFLURANE IN SOLN
RESPIRATORY_TRACT | Status: AC
  Filled 2018-01-04: qty 250

## 2018-01-04 MED ORDER — DIPHENHYDRAMINE HCL 12.5 MG/5ML PO ELIX
12.5000 mg | ORAL_SOLUTION | Freq: Four times a day (QID) | ORAL | Status: DC | PRN
  Filled 2018-01-04: qty 5

## 2018-01-04 MED ORDER — ONDANSETRON HCL 4 MG/2ML IJ SOLN: 4.0000 mg | Freq: Once | INTRAMUSCULAR | Status: DC | PRN

## 2018-01-04 MED ORDER — OXYCODONE HCL 5 MG PO TABS: 5.0000 mg | ORAL_TABLET | ORAL | Status: DC | PRN

## 2018-01-04 MED ORDER — SUCCINYLCHOLINE CHLORIDE 20 MG/ML IJ SOLN
INTRAMUSCULAR | Status: AC
  Filled 2018-01-04: qty 1

## 2018-01-04 MED ORDER — PIPERACILLIN-TAZOBACTAM 3.375 G IVPB
3.3750 g | Freq: Three times a day (TID) | INTRAVENOUS | Status: DC
  Administered 2018-01-04 – 2018-01-05 (×4): 3.375 g via INTRAVENOUS
  Filled 2018-01-04 (×4): qty 50

## 2018-01-04 MED ORDER — HYDRALAZINE HCL 20 MG/ML IJ SOLN: 10.0000 mg | INTRAMUSCULAR | Status: DC | PRN

## 2018-01-04 MED ORDER — ACETAMINOPHEN 500 MG PO TABS: 1000.0000 mg | ORAL_TABLET | Freq: Four times a day (QID) | ORAL | Status: DC | PRN

## 2018-01-04 MED ORDER — PANTOPRAZOLE SODIUM 40 MG IV SOLR
40.0000 mg | Freq: Every day | INTRAVENOUS | Status: DC
  Administered 2018-01-04: 40 mg via INTRAVENOUS
  Filled 2018-01-04: qty 40

## 2018-01-04 MED ORDER — ONDANSETRON HCL 4 MG/2ML IJ SOLN
INTRAMUSCULAR | Status: AC
  Administered 2018-01-04: 4 mg via INTRAVENOUS
  Filled 2018-01-04: qty 2

## 2018-01-04 MED ORDER — LIDOCAINE HCL (PF) 2 % IJ SOLN
INTRAMUSCULAR | Status: AC
  Filled 2018-01-04: qty 10

## 2018-01-04 MED ORDER — FENTANYL CITRATE (PF) 100 MCG/2ML IJ SOLN
INTRAMUSCULAR | Status: AC
  Filled 2018-01-04: qty 4

## 2018-01-04 MED ORDER — ONDANSETRON HCL 4 MG/2ML IJ SOLN
4.0000 mg | Freq: Four times a day (QID) | INTRAMUSCULAR | Status: DC | PRN
  Administered 2018-01-04 (×3): 4 mg via INTRAVENOUS
  Filled 2018-01-04: qty 2

## 2018-01-04 MED ORDER — SODIUM CHLORIDE 0.9 % IV SOLN
INTRAVENOUS | Status: DC | PRN
  Administered 2018-01-04: 13:00:00 via INTRAVENOUS

## 2018-01-04 MED ORDER — DIPHENHYDRAMINE HCL 50 MG/ML IJ SOLN: 12.5000 mg | Freq: Four times a day (QID) | INTRAMUSCULAR | Status: DC | PRN

## 2018-01-04 MED ORDER — PROPOFOL 10 MG/ML IV BOLUS
INTRAVENOUS | Status: DC | PRN
  Administered 2018-01-04: 150 mg via INTRAVENOUS

## 2018-01-04 MED ORDER — SUGAMMADEX SODIUM 200 MG/2ML IV SOLN
INTRAVENOUS | Status: DC | PRN
  Administered 2018-01-04: 166 mg via INTRAVENOUS

## 2018-01-04 MED ORDER — KETOROLAC TROMETHAMINE 30 MG/ML IJ SOLN
15.0000 mg | Freq: Once | INTRAMUSCULAR | Status: AC
  Administered 2018-01-04 (×2): 15 mg via INTRAVENOUS

## 2018-01-04 MED ORDER — KETOROLAC TROMETHAMINE 15 MG/ML IJ SOLN
15.0000 mg | Freq: Four times a day (QID) | INTRAMUSCULAR | Status: DC | PRN
  Administered 2018-01-04 – 2018-01-05 (×3): 15 mg via INTRAVENOUS
  Filled 2018-01-04 (×3): qty 1

## 2018-01-04 MED ORDER — LIDOCAINE HCL (CARDIAC) 20 MG/ML IV SOLN
INTRAVENOUS | Status: DC | PRN
  Administered 2018-01-04: 100 mg via INTRAVENOUS

## 2018-01-04 MED ORDER — BUPIVACAINE-EPINEPHRINE (PF) 0.25% -1:200000 IJ SOLN
INTRAMUSCULAR | Status: DC | PRN
  Administered 2018-01-04: 30 mL

## 2018-01-04 MED ORDER — ROCURONIUM BROMIDE 100 MG/10ML IV SOLN
INTRAVENOUS | Status: DC | PRN
  Administered 2018-01-04: 10 mg via INTRAVENOUS
  Administered 2018-01-04: 40 mg via INTRAVENOUS
  Administered 2018-01-04: 10 mg via INTRAVENOUS

## 2018-01-04 MED ORDER — SUCCINYLCHOLINE CHLORIDE 20 MG/ML IJ SOLN
INTRAMUSCULAR | Status: DC | PRN
  Administered 2018-01-04: 100 mg via INTRAVENOUS

## 2018-01-04 MED ORDER — ONDANSETRON HCL 4 MG/2ML IJ SOLN
INTRAMUSCULAR | Status: AC
  Filled 2018-01-04: qty 2

## 2018-01-04 MED ORDER — PHENYLEPHRINE HCL 10 MG/ML IJ SOLN
INTRAMUSCULAR | Status: AC
Start: 2018-01-04 — End: 2018-01-04
  Filled 2018-01-04: qty 1

## 2018-01-04 MED ORDER — ONDANSETRON HCL 4 MG/2ML IJ SOLN
4.0000 mg | INTRAMUSCULAR | Status: AC
  Administered 2018-01-04 (×2): 4 mg via INTRAVENOUS

## 2018-01-04 MED ORDER — MIDAZOLAM HCL 2 MG/2ML IJ SOLN
INTRAMUSCULAR | Status: DC | PRN
  Administered 2018-01-04: 2 mg via INTRAVENOUS

## 2018-01-04 MED ORDER — HYDROMORPHONE HCL 1 MG/ML IJ SOLN: 0.5000 mg | INTRAMUSCULAR | Status: DC | PRN

## 2018-01-04 MED ORDER — LACTATED RINGERS IV SOLN
INTRAVENOUS | Status: DC | PRN
  Administered 2018-01-04 (×2): via INTRAVENOUS

## 2018-01-04 MED ORDER — DEXAMETHASONE SODIUM PHOSPHATE 10 MG/ML IJ SOLN
INTRAMUSCULAR | Status: AC
  Filled 2018-01-04: qty 1

## 2018-01-04 MED ORDER — DEXAMETHASONE SODIUM PHOSPHATE 10 MG/ML IJ SOLN
INTRAMUSCULAR | Status: DC | PRN
  Administered 2018-01-04: 10 mg via INTRAVENOUS

## 2018-01-04 MED ORDER — MIDAZOLAM HCL 2 MG/2ML IJ SOLN
INTRAMUSCULAR | Status: AC
  Filled 2018-01-04: qty 2

## 2018-01-04 MED ORDER — FENTANYL CITRATE (PF) 100 MCG/2ML IJ SOLN: 25.0000 ug | INTRAMUSCULAR | Status: DC | PRN

## 2018-01-04 MED ORDER — SUGAMMADEX SODIUM 200 MG/2ML IV SOLN
INTRAVENOUS | Status: AC
  Filled 2018-01-04: qty 2

## 2018-01-04 MED ORDER — FENTANYL CITRATE (PF) 100 MCG/2ML IJ SOLN
INTRAMUSCULAR | Status: DC | PRN
  Administered 2018-01-04: 50 ug via INTRAVENOUS
  Administered 2018-01-04: 100 ug via INTRAVENOUS
  Administered 2018-01-04: 50 ug via INTRAVENOUS

## 2018-01-04 SURGICAL SUPPLY — 49 items
ADH SKN CLS APL DERMABOND .7 (GAUZE/BANDAGES/DRESSINGS) ×1
APPLIER CLIP 5 13 M/L LIGAMAX5 (MISCELLANEOUS) ×4
APR CLP MED LRG 5 ANG JAW (MISCELLANEOUS) ×2
BAG SPEC RTRVL LRG 6X4 10 (ENDOMECHANICALS) ×1
BLADE SURG 15 STRL LF DISP TIS (BLADE) ×1 IMPLANT
BLADE SURG 15 STRL SS (BLADE) ×2
CANISTER SUCT 1200ML W/VALVE (MISCELLANEOUS) ×2 IMPLANT
CATH CHOLANGI 4FR 420404F (CATHETERS) IMPLANT
CHLORAPREP W/TINT 26ML (MISCELLANEOUS) ×2 IMPLANT
CLIP APPLIE 5 13 M/L LIGAMAX5 (MISCELLANEOUS) ×1 IMPLANT
CONRAY 60ML FOR OR (MISCELLANEOUS) ×2 IMPLANT
DERMABOND ADVANCED (GAUZE/BANDAGES/DRESSINGS) ×1
DERMABOND ADVANCED .7 DNX12 (GAUZE/BANDAGES/DRESSINGS) ×1 IMPLANT
DRAPE C-ARM XRAY 36X54 (DRAPES) IMPLANT
ELECT REM PT RETURN 9FT ADLT (ELECTROSURGICAL) ×2
ELECTRODE REM PT RTRN 9FT ADLT (ELECTROSURGICAL) ×1 IMPLANT
FILTER LAP SMOKE EVAC STRL (MISCELLANEOUS) ×2 IMPLANT
GLOVE SURG SYN 7.0 (GLOVE) ×2 IMPLANT
GLOVE SURG SYN 7.0 PF PI (GLOVE) ×1 IMPLANT
GLOVE SURG SYN 7.5  E (GLOVE) ×1
GLOVE SURG SYN 7.5 E (GLOVE) ×1 IMPLANT
GLOVE SURG SYN 7.5 PF PI (GLOVE) ×1 IMPLANT
GOWN STRL REUS W/ TWL LRG LVL3 (GOWN DISPOSABLE) ×3 IMPLANT
GOWN STRL REUS W/TWL LRG LVL3 (GOWN DISPOSABLE) ×6
IRRIGATION STRYKERFLOW (MISCELLANEOUS) IMPLANT
IRRIGATOR STRYKERFLOW (MISCELLANEOUS)
IV CATH ANGIO 12GX3 LT BLUE (NEEDLE) ×2 IMPLANT
IV NS 1000ML (IV SOLUTION)
IV NS 1000ML BAXH (IV SOLUTION) IMPLANT
JACKSON PRATT 10 (INSTRUMENTS) IMPLANT
L-HOOK LAP DISP 36CM (ELECTROSURGICAL) ×2
LABEL OR SOLS (LABEL) ×2 IMPLANT
LHOOK LAP DISP 36CM (ELECTROSURGICAL) ×1 IMPLANT
NEEDLE HYPO 22GX1.5 SAFETY (NEEDLE) ×4 IMPLANT
PACK LAP CHOLECYSTECTOMY (MISCELLANEOUS) ×2 IMPLANT
PENCIL ELECTRO HAND CTR (MISCELLANEOUS) ×2 IMPLANT
POUCH SPECIMEN RETRIEVAL 10MM (ENDOMECHANICALS) ×2 IMPLANT
SCISSORS METZENBAUM CVD 33 (INSTRUMENTS) ×2 IMPLANT
SLEEVE ADV FIXATION 5X100MM (TROCAR) ×6 IMPLANT
SPONGE VERSALON 4X4 4PLY (MISCELLANEOUS) IMPLANT
SUT MNCRL 4-0 (SUTURE) ×2
SUT MNCRL 4-0 27XMFL (SUTURE) ×1
SUT VICRYL 0 AB UR-6 (SUTURE) ×3 IMPLANT
SUT VICRYL AB 3-0 FS1 BRD 27IN (SUTURE) ×1 IMPLANT
SUTURE MNCRL 4-0 27XMF (SUTURE) ×1 IMPLANT
TROCAR BALLN GELPORT 12X130M (ENDOMECHANICALS) ×2 IMPLANT
TROCAR Z-THREAD FIOS 11X100 BL (TROCAR) IMPLANT
TROCAR Z-THREAD OPTICAL 5X100M (TROCAR) ×2 IMPLANT
TUBING INSUFFLATION (TUBING) ×2 IMPLANT

## 2018-01-04 NOTE — H&P (Signed)
Patient ID: Alicia Mcbride, female   DOB: 02-04-1951, 67 y.o.   MRN: [id number]  CC: Abdominal pain  HPI Alicia Mcbride is a 67 y.o. female who presents emergency department with 1 day history of abdominal pain.  Patient reports that she had a taco salad for lunch and shortly thereafter developed abdominal pain followed by extensive nausea and vomiting.  After multiple hours of persistent pain, nausea, vomiting she presented to the emergency department for further workup.  Patient reports she is never had pain like this before.  She is recently postop from bilateral knee replacements but states she is doing well from that standpoint.  Prior to eating taco salad she was feeling well.  She says she has had some subjective chills but denies any fevers, diarrhea, constipation, shortness of breath.  She did have some lower chest pain when this started making her think she is having a heart attack.  Upon presentation to the emergency department she was found to have midepigastric to right upper quadrant pain and was worked up for cholecystitis.  HPI  Past Medical History:  Diagnosis Date  . Allergy   . Gynecological examination    sees Dr. Alden Hipp  . Hypertension   . PONV (postoperative nausea and vomiting)     Past Surgical History:  Procedure Laterality Date  . COLONOSCOPY  April 2007   per Dr. Olevia Perches, polyp, repeat in 10 yrs   . TONSILLECTOMY    . 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 TUBAL LIGATION  1985    Family History  Problem Relation Age of Onset  . Hypertension Mother   . Thyroid disease Father     Social History Social History   Tobacco Use  . Smoking status: Never Smoker  . Smokeless tobacco: Never Used  Substance Use Topics  . Alcohol use: Yes    Alcohol/week: 0.0 oz    Comment: couple of glasses of wine each day  . Drug  use: No    No Known Allergies  Current Facility-Administered Medications  Medication Dose Route Frequency Provider Last Rate Last Dose  . ketorolac (TORADOL) 30 MG/ML injection           . ondansetron (ZOFRAN) 4 MG/2ML injection           . sodium chloride 0.9 % bolus 1,000 mL  1,000 mL Intravenous STAT Hinda Kehr, MD       Current Outpatient Medications  Medication Sig Dispense Refill  . Ibuprofen-Diphenhydramine Cit (ADVIL PM PO) Take 2 tablets by mouth at bedtime.    Marland Kitchen losartan-hydrochlorothiazide (HYZAAR) 100-25 MG tablet Take 1 tablet by mouth daily. 30 tablet 11     Review of Systems A multi-point review of systems was asked and was negative except for the findings documented in the HPI  Physical Exam Blood pressure (!) 114/91, pulse 66, temperature 97.8 F (36.6 C), resp. rate 15, height 5\' 8"  (1.727 m), weight 82.1 kg (181 lb), SpO2 95 %. CONSTITUTIONAL: No acute distress. EYES: Pupils are equal, round, and reactive to light, Sclera are non-icteric. EARS, NOSE, MOUTH AND THROAT: The oropharynx is clear. The oral mucosa is pink and moist. Hearing is intact to voice. LYMPH NODES:  Lymph nodes in the neck are normal. RESPIRATORY:  Lungs are clear. There is normal respiratory effort, with equal breath sounds bilaterally, and without pathologic use of accessory  muscles. CARDIOVASCULAR: Heart is regular without murmurs, gallops, or rubs. GI: The abdomen is soft, minimally tender to palpation in the right upper quadrant with a questionable Murphy sign, and nondistended. There are no palpable masses. There is no hepatosplenomegaly. There are normal bowel sounds in all quadrants. GU: Rectal deferred.   MUSCULOSKELETAL: Normal muscle strength and tone. No cyanosis or edema.   SKIN: Turgor is good and there are no pathologic skin lesions or ulcers. NEUROLOGIC: Motor and sensation is grossly normal. Cranial nerves are grossly intact. PSYCH:  Oriented to person, place and time.  Affect is normal.  Data Reviewed Images and labs reviewed.  On labs she had a normal white blood cell count 9.2 with a normal H&H and platelet count.  Her electrolytes are actually mostly within normal limits with a mild hypokalemia of 3.3.  All of her LFTs are normal to include a normal bilirubin, ALT, AST, alkaline phosphatase.  On CT scan there was some calcifications within the body of the gallbladder but no obvious inflammation around it.  Under ultrasound was noted to be thickened gallbladder wall with numerous stones and a stone stuck in the gallbladder neck.  This was consistent with acute cholecystitis. I have personally reviewed the patient's imaging, laboratory findings and medical records.    Assessment    Acute cholecystitis    Plan    67 year old female with acute cholecystitis .I discussed the diagnosis and procedure of a cholecystectomy in detail. We discussed the risks and benefits of a laparoscopic cholecystectomy and possible cholangiogram including, but not limited to bleeding, infection, injury to surrounding structures such as the intestine or liver, bile leak, retained gallstones, need to convert to an open procedure, prolonged diarrhea, blood clots such as  DVT, common bile duct injury, anesthesia risks, and possible need for additional procedures.  The likelihood of improvement in symptoms and return to the patient's normal status is good. We discussed the typical post-operative recovery course.  Given her acute presentation and the thickness of the gallbladder wall, I discussed the possibility of needing to stay numerous nights in the hospital.  Discussed the possibility of needing a drain specifically.  She voiced understanding and desires to proceed.  She knows she will undergo surgery later today by my daytime partner, Dr. Hampton Abbot.  She agrees with this plan and accepts admission to the hospital.      Time spent with the patient was 50 minutes, with more than 50% of the  time spent in face-to-face education, counseling and care coordination.     Clayburn Pert, MD FACS General Surgeon 01/04/2018, 2:36 AM

## 2018-01-04 NOTE — Anesthesia Post-op Follow-up Note (Signed)
Anesthesia QCDR form completed.        

## 2018-01-04 NOTE — Anesthesia Preprocedure Evaluation (Signed)
Anesthesia Evaluation  Patient identified by MRN, date of birth, ID band Patient awake    Reviewed: Allergy & Precautions, H&P , NPO status , Patient's Chart, lab work & pertinent test results, reviewed documented beta blocker date and time   History of Anesthesia Complications (+) PONV and history of anesthetic complications  Airway Mallampati: II  TM Distance: >3 FB Neck ROM: full    Dental  (+) Teeth Intact   Pulmonary neg pulmonary ROS,    Pulmonary exam normal        Cardiovascular Exercise Tolerance: Good hypertension, On Medications negative cardio ROS Normal cardiovascular exam Rhythm:regular Rate:Normal     Neuro/Psych negative neurological ROS  negative psych ROS   GI/Hepatic negative GI ROS, Neg liver ROS,   Endo/Other  negative endocrine ROS  Renal/GU negative Renal ROS  negative genitourinary   Musculoskeletal   Abdominal   Peds  Hematology negative hematology ROS (+)   Anesthesia Other Findings Past Medical History: No date: Allergy No date: Gynecological examination     Comment:  sees Dr. Alden Hipp No date: Hypertension No date: PONV (postoperative nausea and vomiting) Past Surgical History: April 2007: COLONOSCOPY     Comment:  per Dr. Olevia Perches, polyp, repeat in 10 yrs  No date: TONSILLECTOMY 11/11/2017: TOTAL KNEE ARTHROPLASTY; Bilateral     Comment:  Procedure: TOTAL KNEE BILATERAL;  Surgeon: Gaynelle Arabian, MD;  Location: [facility] ORS;  Service: Orthopedics;                Laterality: Bilateral; 1985: TUBAL LIGATION BMI    Body Mass Index:  27.83 kg/m     Reproductive/Obstetrics negative OB ROS                             Anesthesia Physical Anesthesia Plan  ASA: II  Anesthesia Plan: General ETT   Post-op Pain Management:    Induction:   PONV Risk Score and Plan:   Airway Management Planned:   Additional Equipment:   Intra-op Plan:    Post-operative Plan:   Informed Consent: I have reviewed the patients History and Physical, chart, labs and discussed the procedure including the risks, benefits and alternatives for the proposed anesthesia with the patient or authorized representative who has indicated his/her understanding and acceptance.   Dental Advisory Given  Plan Discussed with: CRNA  Anesthesia Plan Comments:         Anesthesia Quick Evaluation

## 2018-01-04 NOTE — Transfer of Care (Signed)
Immediate Anesthesia Transfer of Care Note  Patient: Alicia Mcbride  Procedure(s) Performed: LAPAROSCOPIC CHOLECYSTECTOMY (N/A Abdomen)  Patient Location: PACU  Anesthesia Type:General  Level of Consciousness: awake, oriented and sedated  Airway & Oxygen Therapy: Patient Spontanous Breathing and Patient connected to face mask oxygen  Post-op Assessment: Report given to RN and Post -op Vital signs reviewed and stable  Post vital signs: Reviewed and stable  Last Vitals:  Vitals:   01/04/18 0840 01/04/18 0923  BP: 134/78 127/71  Pulse: 87 85  Resp: 16 16  Temp: 37 C 36.7 C  SpO2: 98% 97%    Last Pain:  Vitals:   01/04/18 0923  TempSrc: Oral  PainSc:       Patients Stated Pain Goal: 0 (54/36/06 7703)  Complications: No apparent anesthesia complications

## 2018-01-04 NOTE — Anesthesia Procedure Notes (Signed)
Procedure Name: Intubation Date/Time: 01/04/2018 10:08 AM Performed by: Nelda Marseille, CRNA Pre-anesthesia Checklist: Patient identified, Patient being monitored, Timeout performed, Emergency Drugs available and Suction available Patient Re-evaluated:Patient Re-evaluated prior to induction Oxygen Delivery Method: Circle system utilized Preoxygenation: Pre-oxygenation with 100% oxygen Induction Type: IV induction Ventilation: Mask ventilation without difficulty Laryngoscope Size: Mac, 3 and McGraph Grade View: Grade I Tube type: Oral Tube size: 7.0 mm Number of attempts: 1 Airway Equipment and Method: Stylet Placement Confirmation: ETT inserted through vocal cords under direct vision,  positive ETCO2 and breath sounds checked- equal and bilateral Secured at: 21 cm Tube secured with: Tape Dental Injury: Teeth and Oropharynx as per pre-operative assessment  Difficulty Due To: Difficulty was anticipated and Difficult Airway- due to anterior larynx

## 2018-01-04 NOTE — Interval H&P Note (Signed)
History and Physical Interval Note:  01/04/2018 9:38 AM  Alicia Mcbride  has presented today for surgery, with the diagnosis of acute cholecystitis  The various methods of treatment have been discussed with the patient and family. After consideration of risks, benefits and other options for treatment, the patient has consented to  Procedure(s): LAPAROSCOPIC CHOLECYSTECTOMY (N/A) as a surgical intervention .  The patient's history has been reviewed, patient examined, no change in status, stable for surgery.  I have reviewed the patient's chart and labs.  Questions were answered to the patient's satisfaction.     Elysha Daw

## 2018-01-04 NOTE — Op Note (Signed)
  Procedure Date:  01/04/2018  Pre-operative Diagnosis:  Acute cholecystitis  Post-operative Diagnosis:  Acute cholecystitis  Procedure:  Laparoscopic cholecystectomy  Surgeon:  Melvyn Neth, MD  Anesthesia:  General endotracheal  Estimated Blood Loss:  15 ml  Specimens:  gallbladder  Complications:  None  Indications for Procedure:  This is a 67 y.o. female who presents with abdominal pain and workup revealing acute cholecystitis.  The benefits, complications, treatment options, and expected outcomes were discussed with the patient. The risks of bleeding, infection, recurrence of symptoms, failure to resolve symptoms, bile duct damage, bile duct leak, retained common bile duct stone, bowel injury, and need for further procedures were all discussed with the patient and she was willing to proceed.  Description of Procedure: The patient was correctly identified in the preoperative area and brought into the operating room.  The patient was placed supine with VTE prophylaxis in place.  Appropriate time-outs were performed.  Anesthesia was induced and the patient was intubated.  Appropriate antibiotics were infused.  The abdomen was prepped and draped in a sterile fashion. An infraumbilical incision was made. A cutdown technique was used to enter the abdominal cavity without injury, and a Hasson trocar was inserted.  Pneumoperitoneum was obtained with appropriate opening pressures.  A 5-mm port was placed in the subxiphoid area and two 5-mm ports were placed in the right upper quadrant under direct visualization.  Upon initial inspection, the omentum was significantly adhered to the inferior portion of the liver, overlapping the gallbladder.  This was taken down with cautery.  The gallbladder was eventually identified and found to be inflamed and distended.  The fundus was grasped and retracted cephalad.  Adhesions were lysed bluntly and with electrocautery. The infundibulum was grasped and  retracted laterally, exposing the peritoneum overlying the gallbladder.  This was incised with electrocautery and extended on either side of the gallbladder.  The cystic duct and anterior and posterior branches of the cystic artery were clearly identified and bluntly dissected.  Both were clipped twice proximally and once distally, cutting in between.  Gallbladder lymphatic vessels were clipped and cut as well.  The gallbladder was taken from the gallbladder fossa in a retrograde fashion with electrocautery. The gallbladder was placed in an Endocatch bag and brought out via the umbilical incision, which had to be extended in order to allow for the gallbladder to come out. The liver bed was inspected and any bleeding was controlled with electrocautery. The right upper quadrant was then inspected again revealing intact clips, no bleeding, and no ductal injury.  The area was thoroughly irrigated.  The 5 mm ports were removed under direct visualization and the Hasson trocar was removed.  The fascial opening was closed using three 0 vicryl sutures.  Local anesthetic was infused in all incisions and the incisions were closed with 4-0 Monocryl, with a layer of 3-0 Vicryl at the umbilical incision as well.  The wounds were cleaned and sealed with DermaBond.  The patient was emerged from anesthesia and extubated and brought to the recovery room for further management.  The patient tolerated the procedure well and all counts were correct at the end of the case.   Melvyn Neth, MD

## 2018-01-04 NOTE — Progress Notes (Signed)
Initial Nutrition Assessment  DOCUMENTATION CODES:   Not applicable  INTERVENTION:   Recommend Ensure Enlive po BID when diet advanced, each supplement provides 350 kcal and 20 grams of protein  NUTRITION DIAGNOSIS:   Increased nutrient needs related to post-op healing as evidenced by increased estimated needs from protein.  GOAL:   Patient will meet greater than or equal to 90% of their needs  MONITOR:   PO intake, Supplement acceptance, Labs, Weight trends, I & O's, Diet advancement  REASON FOR ASSESSMENT:   Malnutrition Screening Tool    ASSESSMENT:   67 year old female with acute cholecystitis now s/p cholecystectomy   Unable to see pt today as pt in surgery at time of RD visit. Pt with increased estimated needs from protein r/t post-op healing. RD will obtain nutrition related history and exam at follow up. Pt would benefit from low fat diet education prior to discharge. Per chart, pt with 11lb(6%) wt loss over 1 month pta; this is significant given the time frame.    Medications reviewed and include: protonix, NaCl w/ 5% dextrose and KCl @125ml /hr, zosyn, zofran  Labs reviewed:   Unable to complete Nutrition-Focused physical exam at this time.   Diet Order:  Diet clear liquid Room service appropriate? Yes; Fluid consistency: Thin  EDUCATION NEEDS:   Not appropriate for education at this time  Skin:  Skin Assessment: (incision abdomen s/p cholecystectomy )  Last BM:  2/24  Height:   Ht Readings from Last 1 Encounters:  01/04/18 5\' 8"  (1.727 m)    Weight:   Wt Readings from Last 1 Encounters:  01/04/18 183 lb (83 kg)    Ideal Body Weight:  63.6 kg  BMI:  Body mass index is 27.83 kg/m.  Estimated Nutritional Needs:   Kcal:  1600-1900kcal/day   Protein:  83-100g/day   Fluid:  >1.6L/day   Koleen Distance MS, RD, LDN Pager #[phone] After Hours Pager: [phone]

## 2018-01-04 NOTE — Progress Notes (Signed)
Patient refusing bed alarm at this time.  Risks explained in detail and patient verbalizes understanding and acceptance of risk.

## 2018-01-05 DIAGNOSIS — K819 Cholecystitis, unspecified: Secondary | ICD-10-CM

## 2018-01-05 LAB — URINE CULTURE: Special Requests: NORMAL

## 2018-01-05 LAB — HIV ANTIBODY (ROUTINE TESTING W REFLEX): HIV Screen 4th Generation wRfx: NONREACTIVE

## 2018-01-05 LAB — SURGICAL PATHOLOGY

## 2018-01-05 MED ORDER — HYDROCODONE-ACETAMINOPHEN 5-325 MG PO TABS: 1.0000 | ORAL_TABLET | Freq: Four times a day (QID) | ORAL | 0 refills | Status: DC | PRN

## 2018-01-05 MED ORDER — ACETAMINOPHEN 325 MG PO TABS: 650.0000 mg | ORAL_TABLET | Freq: Four times a day (QID) | ORAL | Status: DC | PRN

## 2018-01-05 MED ORDER — IBUPROFEN 600 MG PO TABS: 600.0000 mg | ORAL_TABLET | Freq: Three times a day (TID) | ORAL | 0 refills | Status: DC | PRN

## 2018-01-05 MED ORDER — HYDROCODONE-ACETAMINOPHEN 5-325 MG PO TABS: 1.0000 | ORAL_TABLET | Freq: Four times a day (QID) | ORAL | Status: DC | PRN

## 2018-01-05 NOTE — Care Management Obs Status (Signed)
[place] NOTIFICATION   Patient Details  Name: Alicia Mcbride MRN: [id number] Date of Birth: 1951/03/14   Medicare Observation Status Notification Given:  Yes    Beverly Sessions, RN 01/05/2018, 11:11 AM

## 2018-01-05 NOTE — Discharge Summary (Signed)
Patient ID: Alicia Mcbride MRN: [id number] DOB/AGE: 66-Dec-1952 66 y.o.  Admit date: 01/03/2018 Discharge date: 01/05/2018   Discharge Diagnoses:  Active Problems:   Acute cholecystitis   Procedures:  Laparoscopic cholecystectomy  Hospital Course:  Patient was admitted on 2/24 with acute cholecystitis.  She was taken to the OR on 2/25 and underwent a laparoscopic cholecystectomy.  There were no complications noted.  Post-operatively, her diet was slowly advanced.  Her pain was well controlled.  She was ambulating, voiding, and tolerating a diet.  She was deemed ready for discharge.  On exam, she was in no acute distress and with stable vital signs.  Her abdomen was soft, non-distended, appropriately tender to palpation.  Her incisions were clean, dry, intact with DermaBond and no evidence of infection.  There was mild ecchymosis at the inferior portion of the periumbilical incision.  Consults: None  Disposition: 01 - Home, self-care  Discharge Instructions    Call MD for:  difficulty breathing, headache or visual disturbances   Complete by:  As directed    Call MD for:  persistant nausea and vomiting   Complete by:  As directed    Call MD for:  redness, tenderness, or signs of infection (pain, swelling, redness, odor or green/yellow discharge around incision site)   Complete by:  As directed    Call MD for:  severe uncontrolled pain   Complete by:  As directed    Call MD for:  temperature >100.4   Complete by:  As directed    Diet - low sodium heart healthy   Complete by:  As directed    Discharge instructions   Complete by:  As directed    1.  Patient may shower, but do not scrub wound heavily and dab dry only. 2.  Do not submerge wounds in pool/tub 3.  Do not apply ointments or hydrogen peroxide to the wounds.   Driving Restrictions   Complete by:  As directed    Do not drive while taking narcotics for pain control.   Increase activity slowly   Complete by:  As directed     Lifting restrictions   Complete by:  As directed    No heavy lifting or pushing of more than 10-15 lbs for 4 weeks.   No dressing needed   Complete by:  As directed      Allergies as of 01/05/2018   No Known Allergies     Medication List    TAKE these medications   ADVIL PM PO Take 2 tablets by mouth at bedtime.   HYDROcodone-acetaminophen 5-325 MG tablet Commonly known as:  NORCO/VICODIN Take 1-2 tablets by mouth every 6 (six) hours as needed for severe pain.   ibuprofen 600 MG tablet Commonly known as:  ADVIL,MOTRIN Take 1 tablet (600 mg total) by mouth every 8 (eight) hours as needed for fever, headache, mild pain or moderate pain.   losartan-hydrochlorothiazide 100-25 MG tablet Commonly known as:  HYZAAR Take 1 tablet by mouth daily.      Follow-up Information    Aleecia Tapia, Jacqulyn Bath, MD Follow up in 2 week(s).   Specialty:  Surgery Why:  Follow up in 2-3 weeks. Contact information: [place] [address] [phone]

## 2018-01-05 NOTE — Care Management Important Message (Signed)
Important Message  Patient Details  Name: Alicia Mcbride MRN: [id number] Date of Birth: 10/15/1951   Medicare Important Message Given:  N/A - LOS <3 / Initial given by admissions    Beverly Sessions, RN 01/05/2018, 10:27 AM

## 2018-01-05 NOTE — Care Management CC44 (Signed)
Condition Code 44 Documentation Completed  Patient Details  Name: Alicia Mcbride MRN: [id number] Date of Birth: October 31, 1951   Condition Code 44 given:  Yes Patient signature on Condition Code 44 notice:  Yes Documentation of 2 MD's agreement:  Yes Code 44 added to claim:  Yes    Beverly Sessions, RN 01/05/2018, 11:11 AM

## 2018-01-05 NOTE — Progress Notes (Signed)
01/05/2018  12:48 PM  Alicia Mcbride to be D/C'd Home per MD order.  Discussed prescriptions and follow up appointments with the patient. Prescriptions given to patient, medication list explained in detail. Pt verbalized understanding.  Allergies as of 01/05/2018   No Known Allergies     Medication List    TAKE these medications   ADVIL PM PO Take 2 tablets by mouth at bedtime.   HYDROcodone-acetaminophen 5-325 MG tablet Commonly known as:  NORCO/VICODIN Take 1-2 tablets by mouth every 6 (six) hours as needed for severe pain.   ibuprofen 600 MG tablet Commonly known as:  ADVIL,MOTRIN Take 1 tablet (600 mg total) by mouth every 8 (eight) hours as needed for fever, headache, mild pain or moderate pain.   losartan-hydrochlorothiazide 100-25 MG tablet Commonly known as:  HYZAAR Take 1 tablet by mouth daily.       Vitals:   01/04/18 2039 01/05/18 0429  BP: (!) 143/75 127/66  Pulse: 78 72  Resp: 18 18  Temp: 98.3 F (36.8 C) 98 F (36.7 C)  SpO2: 99% 100%    Skin clean, dry and intact without evidence of skin break down, no evidence of skin tears noted. IV catheter discontinued intact. Site without signs and symptoms of complications. Dressing and pressure applied. Pt denies pain at this time. No complaints noted.  An After Visit Summary was printed and given to the patient. Patient escorted via [place], and D/C home via private auto.  [name]

## 2018-01-06 NOTE — Anesthesia Postprocedure Evaluation (Signed)
Anesthesia Post Note  Patient: Alicia Mcbride  Procedure(s) Performed: LAPAROSCOPIC CHOLECYSTECTOMY (N/A Abdomen)  Patient location during evaluation: PACU Anesthesia Type: General Level of consciousness: awake and alert Pain management: pain level controlled Vital Signs Assessment: post-procedure vital signs reviewed and stable Respiratory status: spontaneous breathing, nonlabored ventilation, respiratory function stable and patient connected to nasal cannula oxygen Cardiovascular status: blood pressure returned to baseline and stable Postop Assessment: no apparent nausea or vomiting Anesthetic complications: no     Last Vitals:  Vitals:   01/04/18 2039 01/05/18 0429  BP: (!) 143/75 127/66  Pulse: 78 72  Resp: 18 18  Temp: 36.8 C 36.7 C  SpO2: 99% 100%    Last Pain:  Vitals:   01/05/18 0457  TempSrc:   PainSc: Asleep                 Molli Barrows

## 2018-01-07 ENCOUNTER — Ambulatory Visit: Payer: PPO | Admitting: Physical Therapy

## 2018-01-11 ENCOUNTER — Ambulatory Visit: Payer: PPO | Admitting: Physical Therapy

## 2018-01-14 ENCOUNTER — Encounter: Payer: Self-pay | Admitting: Physical Therapy

## 2018-01-14 ENCOUNTER — Ambulatory Visit: Payer: PPO | Attending: Orthopedic Surgery | Admitting: Physical Therapy

## 2018-01-14 DIAGNOSIS — M25662 Stiffness of left knee, not elsewhere classified: Secondary | ICD-10-CM | POA: Diagnosis not present

## 2018-01-14 DIAGNOSIS — M25661 Stiffness of right knee, not elsewhere classified: Secondary | ICD-10-CM | POA: Diagnosis not present

## 2018-01-14 NOTE — Therapy (Signed)
[facility] [address], [place], [address] Phone: [phone]   Fax:  [phone]  Physical Therapy Treatment/Discharge Summary  Patient Details  Name: Alicia Mcbride MRN: [id number] Date of Birth: 1951/06/26 Referring Provider: Gaynelle Arabian   Encounter Date: 01/14/2018  PT End of Session - 01/14/18 1435    Visit Number  10    Number of Visits  18    Date for PT Re-Evaluation  12/31/17    Authorization Type  [employer]    Authorization Time Period  11/30/17-12/29/17    PT Start Time  0139    PT Stop Time  0222    PT Time Calculation (min)  43 min    Activity Tolerance  Patient tolerated treatment well    Behavior During Therapy  [facility] for tasks assessed/performed       Past Medical History:  Diagnosis Date  . Allergy   . Gynecological examination    sees Dr. Alden Hipp  . Hypertension   . PONV (postoperative nausea and vomiting)     Past Surgical History:  Procedure Laterality Date  . CHOLECYSTECTOMY N/A 01/04/2018   Procedure: LAPAROSCOPIC CHOLECYSTECTOMY;  Surgeon: Olean Ree, MD;  Location: [facility] ORS;  Service: General;  Laterality: N/A;  . COLONOSCOPY  April 2007   per Dr. Olevia Perches, polyp, repeat in 10 yrs   . TONSILLECTOMY    . 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 TUBAL LIGATION  1985    There were no vitals filed for this visit.  Subjective Assessment - 01/14/18 1341    Subjective  Pt is returning after 2 weeks away from PT d/t emergency gall bladder removal on 01/06/18. Patient reports she was unable to complete exercises following surgery and was able to complete her HEP 3x this week and has began riding her bike at home again this week. She reports no pain in the knees, just some aching soreness, and min abdominal pain following sx.     Patient is accompained by:  Family member    Pertinent History  No prior history knee/hip surgery.     Limitations  Standing;Walking    How long can you sit comfortably?  not limited    How long can you stand comfortably?  10-15 minutes (showing and dental hygiene)     How long can you walk comfortably?  ~315f at STR.     Patient Stated Goals  Be able to walk on sand and play on beach with grandkids.     Currently in Pain?  No/denies    Pain Score  0-No pain    Pain Location  Knee    Pain Orientation  Right;Left    Pain Descriptors / Indicators  Discomfort    Pain Type  Acute pain    Pain Frequency  Occasional          Ther-Ex6MM -Nustep 4 mins during history intake and ceasing POC at this time if all goals are met (unbilled) -6MWT w/ patient demonstrating equal stride length and proper heel > toe relationship with adequate tibial translation- patient reported feeling  -5x STS stance x3 trials w/o UE support -Stair flexion stretch 3x 30sec each LE  - SLS exercise on airex pad x3 trials each LE  Reviewed HEP with 1x demonstration of each to ensure proper form (needed no-min cuing): -Seated  hamstring stretch 30sec holds 4x/ day  -Stair bending stretch 30sec 3x 2x/day -Scoot stretch 30sec 3x 2x/day -Standing on 1 leg at counter top hold for as long as possible (30sec is goal) -Squat 3x 12 -Standing hip abd with theraband 3x 12  -Standing hip ext with theraband 3x 12 -Standing heel raises 3x 15 slowly lowering -Crab walk side to side with theraband in mini squat position -Wall sit exercise 4x 30sec holds (lower you go the harder the exercise                      PT Education - 01/14/18 1433    Education provided  Yes    Education Details  Exercise form, HEP review with education on continuing HEP in home    Person(s) Educated  Patient    Methods  Explanation;Demonstration;Tactile cues;Verbal cues;Handout    Comprehension  Verbal cues required;Returned  demonstration;Verbalized understanding;Tactile cues required       PT Short Term Goals - 11/30/17 1727      PT SHORT TERM GOAL #1   Title  After 4 weeks patient will demonstrate improve bilat knee P/ROM to 12-113 degrees bilat to improve tolerance to ADL performance.     Time  4    Period  Weeks    Status  New    Target Date  12/28/17      PT SHORT TERM GOAL #2   Title  After 4 weeks patient will demonstrate improved functional mobility AEB tolerance of 3 minutes sustained walking s AD, averaging 0.23ms.     Time  4    Period  Weeks    Status  New    Target Date  12/28/17      PT SHORT TERM GOAL #3   Title  After 4 weeks patient will demonstrate improved functional strength as evidenced by 5xSTS <20sec hands free from chair.     Time  4    Period  Weeks    Status  New    Target Date  12/28/17        PT Long Term Goals - 01/14/18 1344      PT LONG TERM GOAL #1   Title  After 8 weeks patient will demonstrate improved bilat knee ROM <10 degrees to >120 degrees bilat.     Baseline  R AROM knee (supine) ext:  -5d flex: 117d L AROM knee (supine) ext: -7d flex: 114d // PROM using stair R flex: L 122d flex: 121d    Time  8    Period  Weeks    Status  Achieved      PT LONG TERM GOAL #2   Title  After 8 weeks patient will demonstrate improved functional strength AEB 5xSTS<11 seconds from standard chair height hands free.     Baseline  01/14/18 10sec hands free    Time  2    Period  Days    Status  Achieved      PT LONG TERM GOAL #3   Title  After 8 weeks patient will demonstrate improved tolerance to community distance AMB AEB 6MWT averagiing 1.170m or greater.     Baseline  [id number] 6mi71m1.75m/70m   Time  8    Period  Weeks    Status  Achieved      PT LONG TERM GOAL #4   Title  After 8 weeks patient will demonstrate improved single limb stability on unstable surfaces AEB SLS>15sec bilar on airex to  improve tolerance to AMB on the beach.     Baseline  12/28/17 L:v12sec  R: 16sec    Time  8    Period  Weeks    Status  Partially Met            Plan - 01/14/18 1444    Clinical Impression Statement  Despite set back of unexpected surgery, patient has continued to progress and has now achieved all goals. Patient is very motivated and PT and her discussed patient continuing robust HEP on her own and calling clinic back if she has any further trouble. Continued HEP is listed in treatment note, and patient was able to demonstrate all exercises with correct form. Patient will be seen by surgeon Monday as well for follow up.     Rehab Potential  Good    PT Frequency  2x / week    PT Duration  4 weeks    PT Treatment/Interventions  ADLs/Self Care Home Management;Balance training;Cryotherapy;Electrical Stimulation;Therapeutic exercise;Therapeutic activities;Functional mobility training;Stair training;Gait training;DME Instruction;Cognitive remediation;Scar mobilization;Compression bandaging;Passive range of motion;Dry needling    PT Next Visit Plan  d/c    PT Home Exercise Plan  see treatment note    Consulted and Agree with Plan of Care  Patient       Patient will benefit from skilled therapeutic intervention in order to improve the following deficits and impairments:  Abnormal gait, Hypomobility, Impaired sensation, Decreased knowledge of precautions, Decreased scar mobility, Increased edema, Decreased activity tolerance, Decreased strength, Pain, Decreased balance, Decreased mobility, Difficulty walking, Decreased range of motion, Postural dysfunction  Visit Diagnosis: Stiffness of right knee, not elsewhere classified  Stiffness of left knee, not elsewhere classified     Problem List Patient Active Problem List   Diagnosis Date Noted  . Cholecystitis 01/05/2018  . Acute cholecystitis 01/04/2018  . OA (osteoarthritis) of knee 04/29/2013  . LOW BACK PAIN 11/21/2010  . VIRAL URI 01/02/2010  . EXTERNAL HEMORRHOIDS 01/20/2008  . Hyperlipemia, mixed  07/23/2007  . Essential hypertension 07/23/2007   Shelton Silvas PT, DPT Shelton Silvas 01/14/2018, 3:04 PM  [facility] [facility] [address] [place], [address] Phone: [phone]   Fax:  [phone]  Name: Alicia Mcbride MRN: [id number] Date of Birth: October 13, 1951

## 2018-01-14 NOTE — Addendum Note (Signed)
Addended by: Shelton Silvas on: 01/14/2018 04:23 PM   Modules accepted: Orders

## 2018-01-25 ENCOUNTER — Encounter: Payer: Self-pay | Admitting: Surgery

## 2018-01-25 ENCOUNTER — Ambulatory Visit (INDEPENDENT_AMBULATORY_CARE_PROVIDER_SITE_OTHER): Payer: PPO | Admitting: Surgery

## 2018-01-25 VITALS — BP 143/78 | HR 80 | Temp 98.3°F | Wt 185.0 lb

## 2018-01-25 DIAGNOSIS — Z09 Encounter for follow-up examination after completed treatment for conditions other than malignant neoplasm: Secondary | ICD-10-CM

## 2018-01-25 NOTE — Progress Notes (Signed)
01/25/2018  HPI: Patient is s/p laparoscopic cholecystectomy on 2/25.  She presents today for follow up.  She reports doing very well.  Denies any nausea or vomiting.  Denies any worsening pain.  Denies any fevers or chills.  Reports that the wounds have been healing very well.  She has normal appetite and bowel movements.  Vital signs: BP (!) 143/78   Pulse 80   Temp 98.3 F (36.8 C) (Oral)   Wt 83.9 kg (185 lb)   BMI 28.13 kg/m    Physical Exam: Constitutional: No acute distress Abdomen: Soft, nondistended, nontender to palpation.  All 4 incisions are healing well and are clean, dry, and intact with no evidence of infection.  Assessment/Plan: 67 yo female s/p laparoscopic cholecystectomy.  --Pathology reviewed with patient.  Acute cholecystitis and cholelithiasis without evidence for dysplasia or malignancy. --Reminded patient of no heavy lifting/pushing restriction of no more than 10-15 lbs until 3/25.  She may resume regular activities after that. --Patient may follow-up with [place] on an as-needed basis.   Melvyn Neth, [place]

## 2018-01-25 NOTE — Patient Instructions (Signed)

## 2018-01-26 DIAGNOSIS — M7672 Peroneal tendinitis, left leg: Secondary | ICD-10-CM | POA: Diagnosis not present

## 2018-03-12 ENCOUNTER — Encounter: Payer: Self-pay | Admitting: Family Medicine

## 2018-03-12 ENCOUNTER — Ambulatory Visit (INDEPENDENT_AMBULATORY_CARE_PROVIDER_SITE_OTHER): Payer: PPO | Admitting: Family Medicine

## 2018-03-12 VITALS — BP 98/52 | HR 78 | Temp 97.9°F | Ht 67.0 in | Wt 177.2 lb

## 2018-03-12 DIAGNOSIS — I10 Essential (primary) hypertension: Secondary | ICD-10-CM

## 2018-03-12 MED ORDER — LOSARTAN POTASSIUM-HCTZ 50-12.5 MG PO TABS: 1.0000 | ORAL_TABLET | Freq: Every day | ORAL | 3 refills | Status: DC

## 2018-03-12 NOTE — Progress Notes (Signed)
   Subjective:    Patient ID: Alicia Mcbride, female    DOB: [birth year], 67 y.o.   MRN: [id number]  HPI Here to follow up on HTN. She feels great. She is exercising and she has lost 40 lbs since we saw here in December. Her BP has actually been low at times. She has had a cholecystectomy and bilateral knee replacements in that time frame, and she has recovered well.   Review of Systems  Constitutional: Negative.   Respiratory: Negative.   Cardiovascular: Negative.   Gastrointestinal: Negative.   Neurological: Negative.        Objective:   Physical Exam  Constitutional: She appears well-developed and well-nourished.  Cardiovascular: Normal rate, regular rhythm, normal heart sounds and intact distal pulses.  Pulmonary/Chest: Effort normal and breath sounds normal. No stridor. No respiratory distress. She has no wheezes. She has no rales.  Musculoskeletal: She exhibits no edema.          Assessment & Plan:  Her HTN is well controlled and we agreed to decrease the dose of Losartan HCT to 50/12.5 daily.  [name], MD

## 2018-03-17 ENCOUNTER — Ambulatory Visit: Payer: PPO

## 2018-03-31 DIAGNOSIS — H2513 Age-related nuclear cataract, bilateral: Secondary | ICD-10-CM | POA: Diagnosis not present

## 2018-09-20 ENCOUNTER — Other Ambulatory Visit: Payer: Self-pay

## 2018-10-16 IMAGING — CT CT ABD-PELV W/ CM
2 of 5 series · 16 of 46 positions shown, 18 images · IV contrast (iopamidol)
Comparison: None.

CLINICAL DATA: 66-year-old female with acute abdominal and pelvic
pain with vomiting.

EXAM:
CT ABDOMEN AND PELVIS WITH CONTRAST
TECHNIQUE: Multidetector CT imaging of the abdomen and pelvis was performed
using the standard protocol following bolus administration of
intravenous contrast.
CONTRAST:  100mL 1VH1KD-VMM IOPAMIDOL (1VH1KD-VMM) INJECTION 61%

[Series 2: routine abd/pel with · axial · 0.74mm/px · z∈[-980,-580]mm · 13 of 91 slices shown, 15 images]
[im 6/91  soft-tissue]
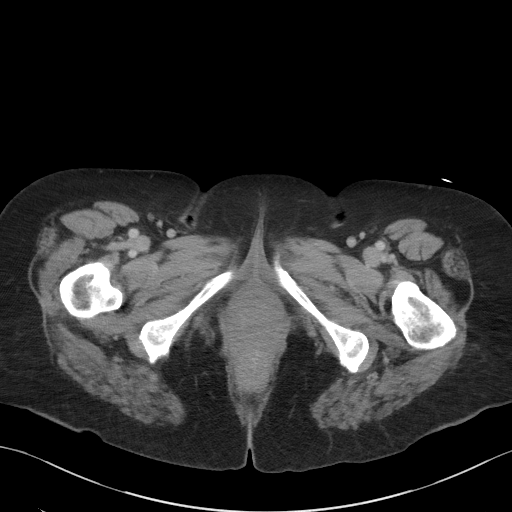
[im 6/91  bone]
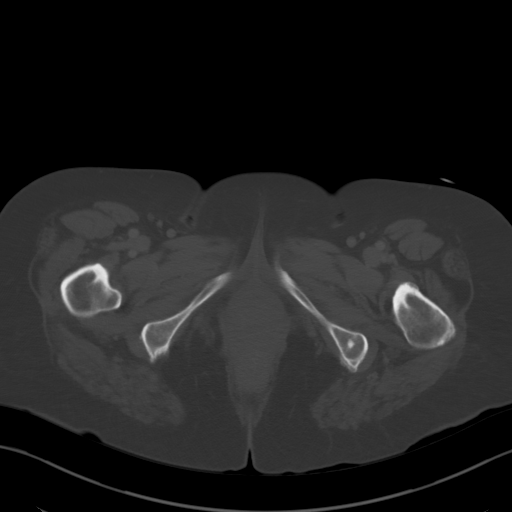
[im 11/91  soft-tissue]
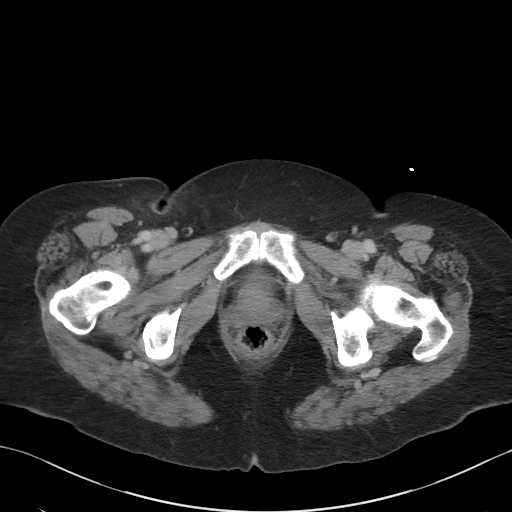
[im 21/91  soft-tissue]
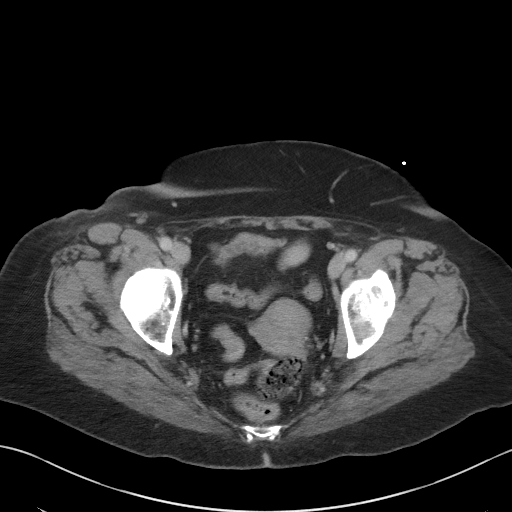
[im 26/91  soft-tissue]
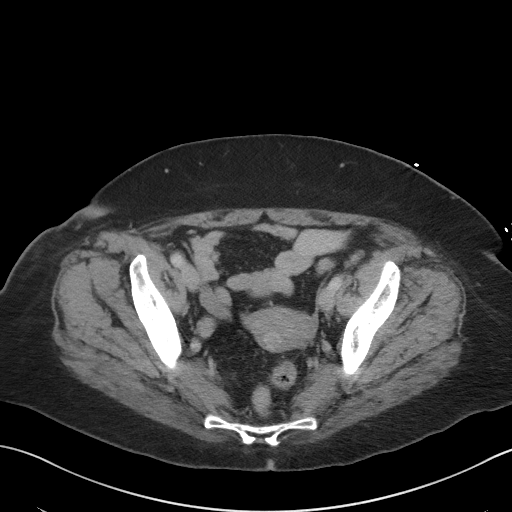
[im 31/91  soft-tissue]
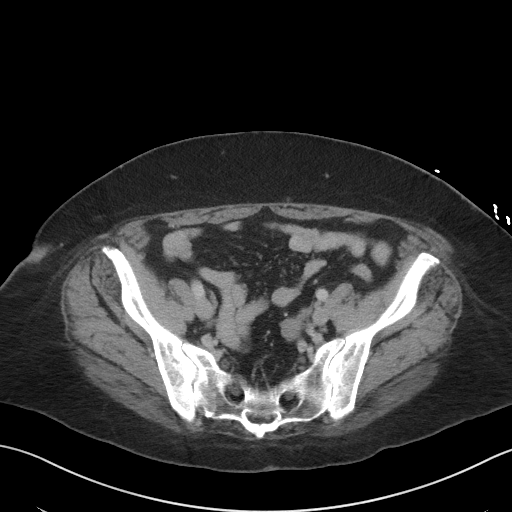
[im 41/91  soft-tissue]
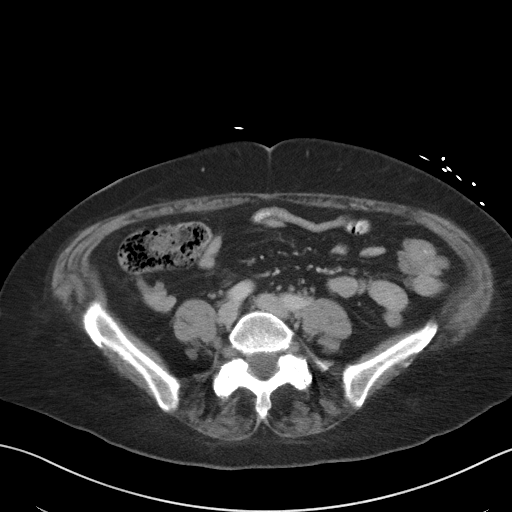
[im 46/91  soft-tissue]
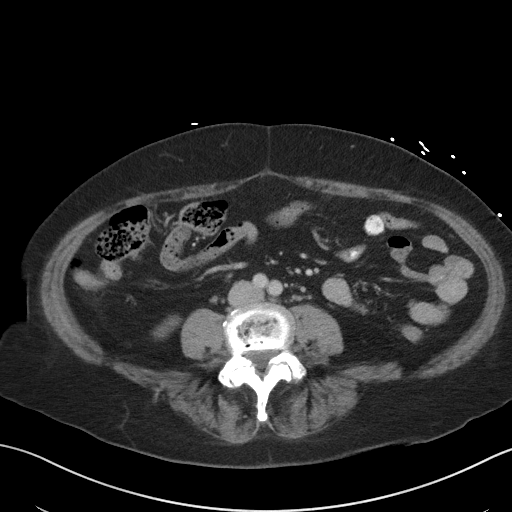
[im 51/91  soft-tissue]
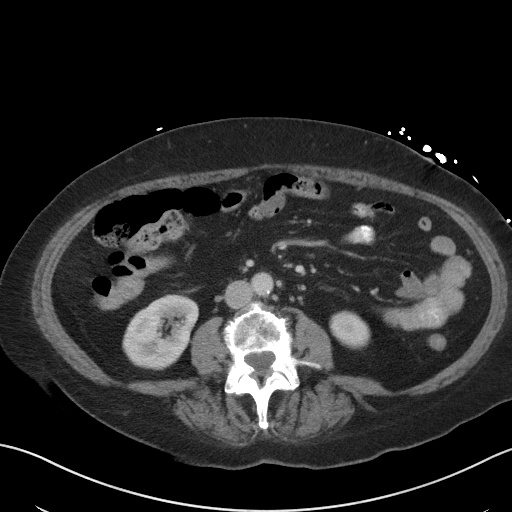
[im 61/91  soft-tissue]
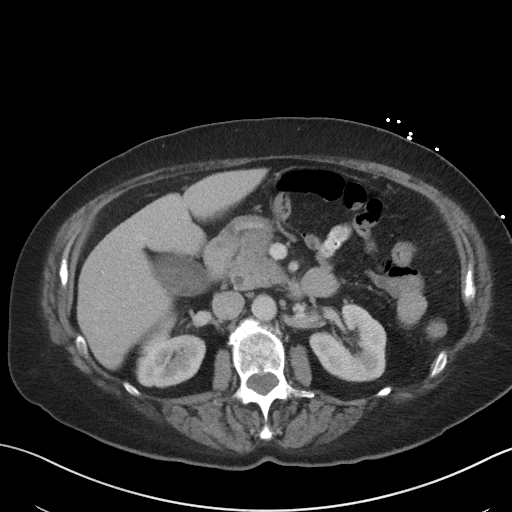
[im 61/91  bone]
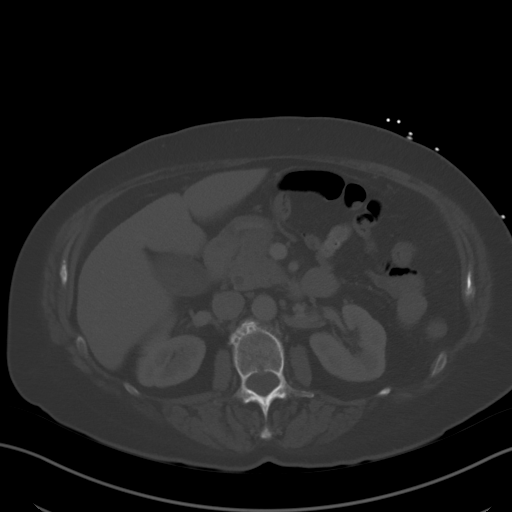
[im 66/91  soft-tissue]
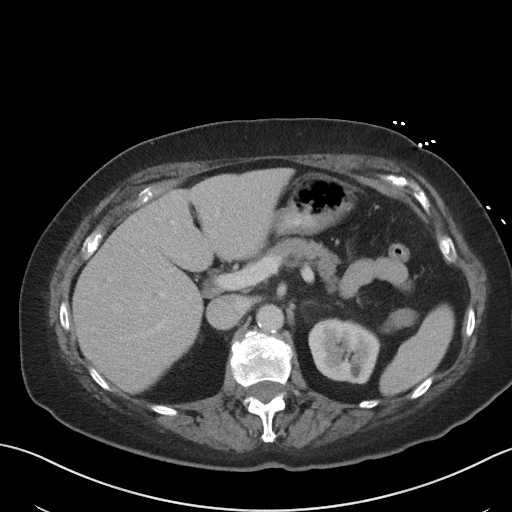
[im 71/91  soft-tissue]
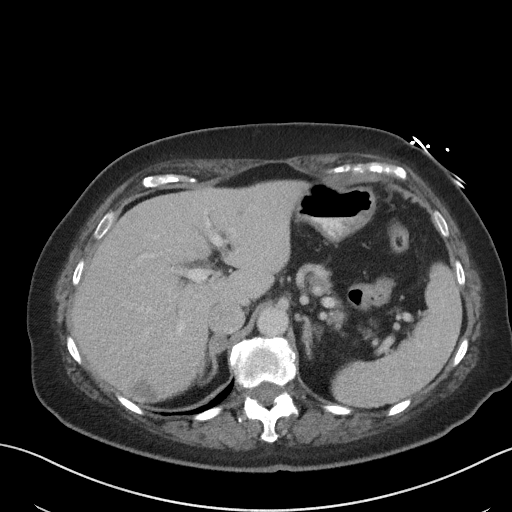
[im 81/91  soft-tissue]
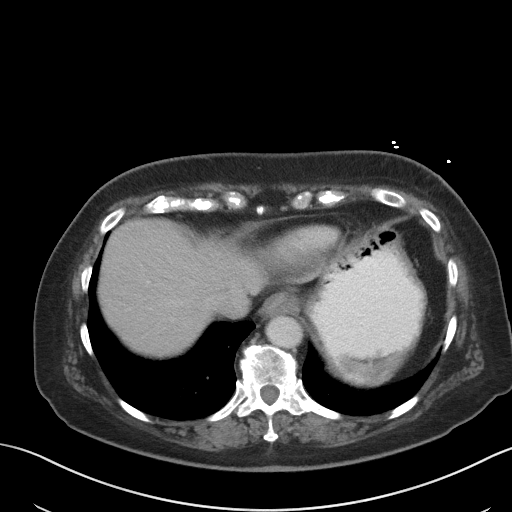
[im 86/91  soft-tissue]
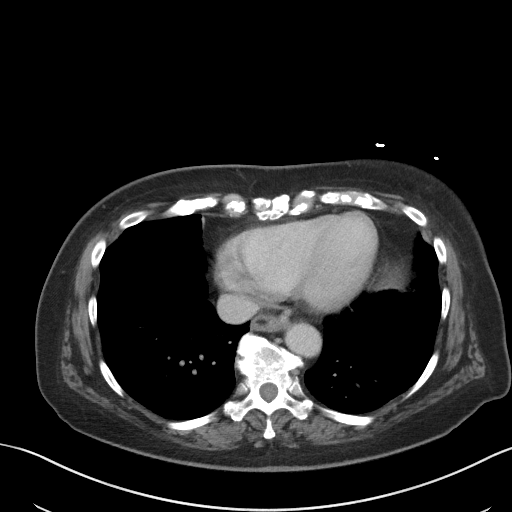

[Series 5: coronal st · coronal · 0.74mm/px · 3 of 89 slices shown]
[im 30/89  soft-tissue]
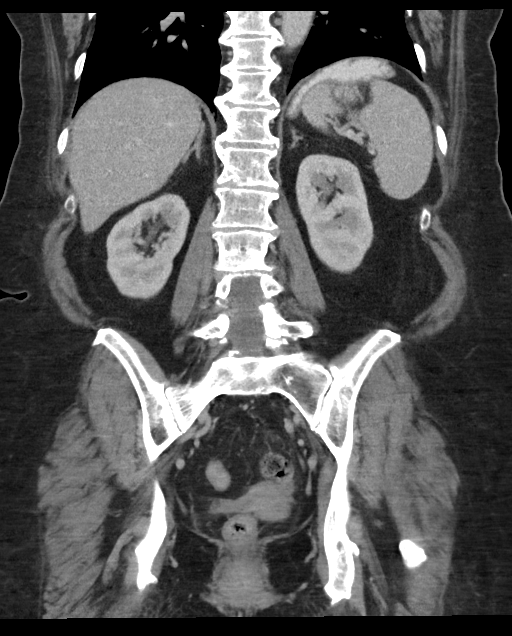
[im 40/89  soft-tissue]
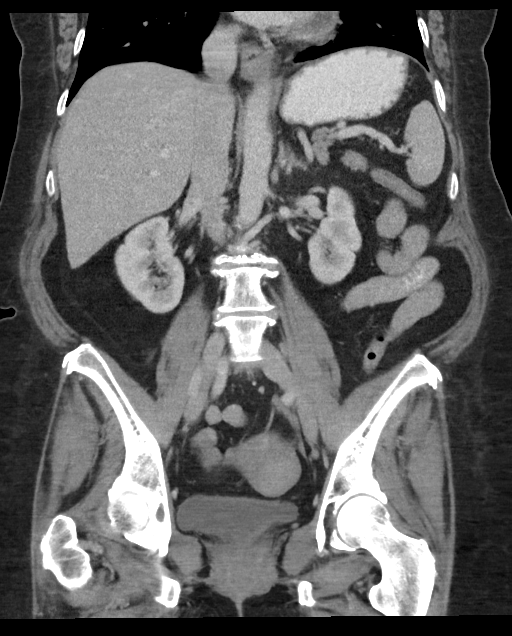
[im 49/89  soft-tissue]
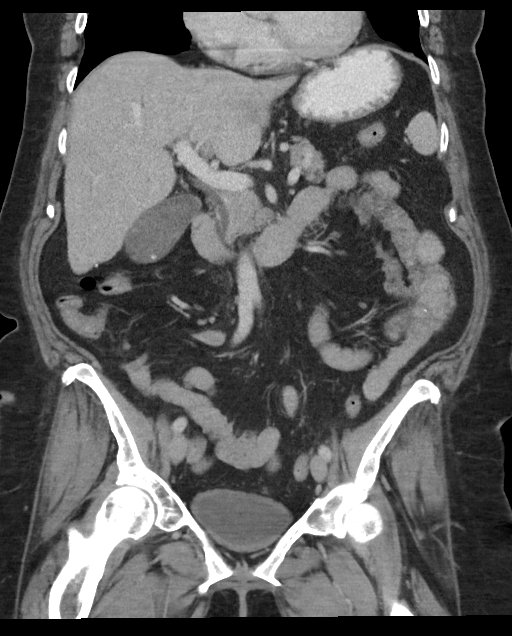

[16 of 46 positions shown; findings below may reference images not displayed]

FINDINGS: Lower chest: No acute abnormality.

Hepatobiliary: Cholelithiasis noted without CT evidence of acute
cholecystitis. A 2 cm hemangioma within the posterior RIGHT liver
noted. No biliary dilatation.

Pancreas: Unremarkable

Spleen: Unremarkable

Adrenals/Urinary Tract: The kidneys, adrenal glands and bladder are
unremarkable except for a small LEFT renal cyst.

Stomach/Bowel: Stomach is within normal limits. Appendix not
visualized. No evidence of bowel wall thickening, distention, or
inflammatory changes.

Vascular/Lymphatic: Aortic atherosclerosis. No enlarged abdominal or
pelvic lymph nodes.

Reproductive: Uterine fibroids are present.  No adnexal masses.

Other: No ascites, abscess or pneumoperitoneum.

Musculoskeletal: No acute bony abnormalities or suspicious bony
lesions. Degenerative changes within the lumbar spine identified.
IMPRESSION: 1. No evidence of acute abnormality
2. Cholelithiasis without CT evidence of acute cholecystitis
3. Uterine fibroids
4.  Aortic Atherosclerosis (491X3-U8K.K).

## 2018-11-17 DIAGNOSIS — Z1231 Encounter for screening mammogram for malignant neoplasm of breast: Secondary | ICD-10-CM | POA: Diagnosis not present

## 2018-12-01 ENCOUNTER — Telehealth: Payer: Self-pay

## 2018-12-01 NOTE — Telephone Encounter (Signed)
Author phoned pt. to set up initial AWV. No answer. Author left detailed VM asking for return call if interested.

## 2019-03-26 ENCOUNTER — Other Ambulatory Visit: Payer: Self-pay | Admitting: Family Medicine

## 2019-03-30 ENCOUNTER — Other Ambulatory Visit: Payer: Self-pay | Admitting: *Deleted

## 2019-03-30 DIAGNOSIS — I10 Essential (primary) hypertension: Secondary | ICD-10-CM

## 2019-03-30 MED ORDER — LOSARTAN POTASSIUM-HCTZ 50-12.5 MG PO TABS: 1.0000 | ORAL_TABLET | Freq: Every day | ORAL | 0 refills | Status: DC

## 2019-04-06 ENCOUNTER — Ambulatory Visit: Payer: PPO | Admitting: Family Medicine

## 2019-04-06 NOTE — Telephone Encounter (Signed)
Patient is currently on lisinopril-HCTZ. Ok to split prescriptions?

## 2019-04-18 ENCOUNTER — Ambulatory Visit (INDEPENDENT_AMBULATORY_CARE_PROVIDER_SITE_OTHER): Payer: PPO | Admitting: Family Medicine

## 2019-04-18 ENCOUNTER — Other Ambulatory Visit: Payer: Self-pay

## 2019-04-18 ENCOUNTER — Encounter: Payer: Self-pay | Admitting: Family Medicine

## 2019-04-18 DIAGNOSIS — I10 Essential (primary) hypertension: Secondary | ICD-10-CM

## 2019-04-18 DIAGNOSIS — Z0000 Encounter for general adult medical examination without abnormal findings: Secondary | ICD-10-CM | POA: Diagnosis not present

## 2019-04-18 MED ORDER — LOSARTAN POTASSIUM 25 MG PO TABS: 25.0000 mg | ORAL_TABLET | Freq: Every day | ORAL | 3 refills | Status: DC

## 2019-04-18 NOTE — Progress Notes (Addendum)
Subjective:    Patient ID: Alicia Mcbride, female    DOB: Dec 21, 1950, 68 y.o.   MRN: [id number]  HPI Virtual Visit via Video Note  I connected with the patient on 05/06/19 at  2:30 PM EDT by a video enabled telemedicine application and verified that I am speaking with the correct person using two identifiers.  Location patient: home Location provider:work or home office Persons participating in the virtual visit: patient, provider  I discussed the limitations of evaluation and management by telemedicine and the availability of in person appointments. The patient expressed understanding and agreed to proceed.   HPI: Here to follow up on HTN. She feels fine and her BP has been stable at home. She needs refills. She is walking every day for exercise. I reminded her that she is past due for a colonoscopy.    ROS: See pertinent positives and negatives per HPI.  Past Medical History:  Diagnosis Date  . Acute cholecystitis 01/04/2018  . Allergy   . Essential hypertension 07/23/2007   Qualifier: Diagnosis of  By: Rogue Bussing CMA, Maryann Alar    . Gynecological examination    sees Dr. Alden Hipp  . Hyperlipemia, mixed 07/23/2007   Qualifier: Diagnosis of  By: Rogue Bussing CMA, Maryann Alar    . Hypertension   . PONV (postoperative nausea and vomiting)     Past Surgical History:  Procedure Laterality Date  . CHOLECYSTECTOMY N/A 01/04/2018   Procedure: LAPAROSCOPIC CHOLECYSTECTOMY;  Surgeon: Olean Ree, MD;  Location: [facility] ORS;  Service: General;  Laterality: N/A;  . COLONOSCOPY  April 2007   per Dr. Olevia Perches, polyp, repeat in 10 yrs   . TONSILLECTOMY    . 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 TUBAL LIGATION  1985    Family History  Problem Relation Age of Onset  . Hypertension Mother   . Thyroid disease Father      Current Outpatient  Medications:  .  hydrochlorothiazide (HYDRODIURIL) 12.5 MG tablet, TAKE 1 TABLET BY MOUTH DAILY (ALONG WITHLOSARTAN), Disp: 90 tablet, Rfl: 3 .  losartan (COZAAR) 25 MG tablet, Take 1 tablet (25 mg total) by mouth daily., Disp: 90 tablet, Rfl: 3 .  MEGARED OMEGA-3 KRILL OIL PO, Take by mouth., Disp: , Rfl:   EXAM:  VITALS per patient if applicable:  GENERAL: alert, oriented, appears well and in no acute distress  HEENT: atraumatic, conjunttiva clear, no obvious abnormalities on inspection of external nose and ears  NECK: normal movements of the head and neck  LUNGS: on inspection no signs of respiratory distress, breathing rate appears normal, no obvious gross SOB, gasping or wheezing  CV: no obvious cyanosis  MS: moves all visible extremities without noticeable abnormality  PSYCH/NEURO: pleasant and cooperative, no obvious depression or anxiety, speech and thought processing grossly intact  ASSESSMENT AND PLAN: Her HTN is stable. We will refill the meds. Hopefully we can get some labs on her soon. I will refer her for a colonoscopy.  [name], MD  Discussed the following assessment and plan:  Essential hypertension  Preventative health care - Plan: Ambulatory referral to Gastroenterology     I discussed the assessment and treatment plan with the patient. The patient was provided an opportunity to ask questions and all were answered. The patient agreed with the plan and demonstrated an understanding of the instructions.   The patient was advised to call  back or seek an in-person evaluation if the symptoms worsen or if the condition fails to improve as anticipated.     Review of Systems     Objective:   Physical Exam        Assessment & Plan:

## 2019-05-24 ENCOUNTER — Encounter: Payer: Self-pay | Admitting: Gastroenterology

## 2019-06-13 ENCOUNTER — Other Ambulatory Visit: Payer: Self-pay

## 2019-06-20 ENCOUNTER — Encounter: Payer: Self-pay | Admitting: Family Medicine

## 2019-06-21 ENCOUNTER — Ambulatory Visit (AMBULATORY_SURGERY_CENTER): Payer: Self-pay | Admitting: *Deleted

## 2019-06-21 ENCOUNTER — Other Ambulatory Visit: Payer: Self-pay

## 2019-06-21 VITALS — Temp 96.6°F | Ht 67.0 in | Wt 163.2 lb

## 2019-06-21 DIAGNOSIS — Z1211 Encounter for screening for malignant neoplasm of colon: Secondary | ICD-10-CM

## 2019-06-21 MED ORDER — PLENVU 140 G PO SOLR: 1.0000 | ORAL | 0 refills | Status: DC

## 2019-06-21 NOTE — Progress Notes (Signed)
No egg or soy allergy known to patient   issues with past sedation with any surgeries  or procedures PONV 1984 , no intubation problems  No diet pills per patient No home 02 use per patient  No blood thinners per patient  Pt denies issues with constipation  No A fib or A flutter  EMMI video sent to pt's e mail   Plenvu sample [phone] Exp 02/2020 due to HTA not covering either pr,ljjuep

## 2019-06-30 ENCOUNTER — Encounter: Payer: Self-pay | Admitting: Gastroenterology

## 2019-07-04 ENCOUNTER — Telehealth: Payer: Self-pay

## 2019-07-04 NOTE — Telephone Encounter (Signed)
Covid-19 screening questions   Do you now or have you had a fever in the last 14 days? NO   Do you have any respiratory symptoms of shortness of breath or cough now or in the last 14 days? NO  Do you have any family members or close contacts with diagnosed or suspected Covid-19 in the past 14 days? NO  Have you been tested for Covid-19 and found to be positive? NO        

## 2019-07-05 ENCOUNTER — Other Ambulatory Visit: Payer: Self-pay

## 2019-07-05 ENCOUNTER — Ambulatory Visit (AMBULATORY_SURGERY_CENTER): Payer: PPO | Admitting: Gastroenterology

## 2019-07-05 ENCOUNTER — Encounter: Payer: Self-pay | Admitting: Gastroenterology

## 2019-07-05 VITALS — BP 111/62 | HR 70 | Temp 97.6°F | Resp 14 | Ht 67.0 in | Wt 163.0 lb

## 2019-07-05 DIAGNOSIS — D120 Benign neoplasm of cecum: Secondary | ICD-10-CM | POA: Diagnosis not present

## 2019-07-05 DIAGNOSIS — D128 Benign neoplasm of rectum: Secondary | ICD-10-CM

## 2019-07-05 DIAGNOSIS — D124 Benign neoplasm of descending colon: Secondary | ICD-10-CM | POA: Diagnosis not present

## 2019-07-05 DIAGNOSIS — D129 Benign neoplasm of anus and anal canal: Secondary | ICD-10-CM

## 2019-07-05 DIAGNOSIS — D122 Benign neoplasm of ascending colon: Secondary | ICD-10-CM

## 2019-07-05 DIAGNOSIS — Z1211 Encounter for screening for malignant neoplasm of colon: Secondary | ICD-10-CM

## 2019-07-05 DIAGNOSIS — K635 Polyp of colon: Secondary | ICD-10-CM | POA: Diagnosis not present

## 2019-07-05 DIAGNOSIS — I10 Essential (primary) hypertension: Secondary | ICD-10-CM | POA: Diagnosis not present

## 2019-07-05 HISTORY — PX: COLONOSCOPY: SHX174

## 2019-07-05 MED ORDER — SODIUM CHLORIDE 0.9 % IV SOLN: 500.0000 mL | Freq: Once | INTRAVENOUS | Status: DC

## 2019-07-05 NOTE — Patient Instructions (Signed)
Handouts provided:  Polyps  YOU HAD AN ENDOSCOPIC PROCEDURE TODAY AT THE [facility]:   Refer to the procedure report that was given to you for any specific questions about what was found during the examination.  If the procedure report does not answer your questions, please call your gastroenterologist to clarify.  If you requested that your care partner not be given the details of your procedure findings, then the procedure report has been included in a sealed envelope for you to review at your convenience later.  YOU SHOULD EXPECT: Some feelings of bloating in the abdomen. Passage of more gas than usual.  Walking can help get rid of the air that was put into your GI tract during the procedure and reduce the bloating. If you had a lower endoscopy (such as a colonoscopy or flexible sigmoidoscopy) you may notice spotting of blood in your stool or on the toilet paper. If you underwent a bowel prep for your procedure, you may not have a normal bowel movement for a few days.  Please Note:  You might notice some irritation and congestion in your nose or some drainage.  This is from the oxygen used during your procedure.  There is no need for concern and it should clear up in a day or so.  SYMPTOMS TO REPORT IMMEDIATELY:   Following lower endoscopy (colonoscopy or flexible sigmoidoscopy):  Excessive amounts of blood in the stool  Significant tenderness or worsening of abdominal pains  Swelling of the abdomen that is new, acute  Fever of 100F or higher  For urgent or emergent issues, a gastroenterologist can be reached at any hour by calling [phone].   DIET:  We do recommend a small meal at first, but then you may proceed to your regular diet.  Drink plenty of fluids but you should avoid alcoholic beverages for 24 hours.  ACTIVITY:  You should plan to take it easy for the rest of today and you should NOT DRIVE or use heavy machinery until tomorrow (because of the sedation  medicines used during the test).    FOLLOW UP: Our staff will call the number listed on your records 48-72 hours following your procedure to check on you and address any questions or concerns that you may have regarding the information given to you following your procedure. If we do not reach you, we will leave a message.  We will attempt to reach you two times.  During this call, we will ask if you have developed any symptoms of COVID 19. If you develop any symptoms (ie: fever, flu-like symptoms, shortness of breath, cough etc.) before then, please call [phone].  If you test positive for Covid 19 in the 2 weeks post procedure, please call and report this information to us.    If any biopsies were taken you will be contacted by phone or by letter within the next 1-3 weeks.  Please call us at [phone] if you have not heard about the biopsies in 3 weeks.    SIGNATURES/CONFIDENTIALITY: You and/or your care partner have signed paperwork which will be entered into your electronic medical record.  These signatures attest to the fact that that the information above on your After Visit Summary has been reviewed and is understood.  Full responsibility of the confidentiality of this discharge information lies with you and/or your care-partner.  

## 2019-07-05 NOTE — Op Note (Signed)
[facility] Patient Name: Alicia Mcbride Procedure Date: 07/05/2019 10:59 AM MRN: [id number] Endoscopist: Mallie Mussel L. Loletha Carrow , MD Age: 68 Referring MD:  Date of Birth: 07-23-1951 Gender: Female Account #: [id number] Procedure:                Colonoscopy Indications:              Screening for colorectal malignant neoplasm (no                            adenomatous polyps on last colonoscopy 02/2006) Medicines:                Monitored Anesthesia Care Procedure:                Pre-Anesthesia Assessment:                           - Prior to the procedure, a History and Physical                            was performed, and patient medications and                            allergies were reviewed. The patient's tolerance of                            previous anesthesia was also reviewed. The risks                            and benefits of the procedure and the sedation                            options and risks were discussed with the patient.                            All questions were answered, and informed consent                            was obtained. Prior Anticoagulants: The patient has                            taken no previous anticoagulant or antiplatelet                            agents. ASA Grade Assessment: II - A patient with                            mild systemic disease. After reviewing the risks                            and benefits, the patient was deemed in                            satisfactory condition to undergo the procedure.  After obtaining informed consent, the colonoscope                            was passed under direct vision. Throughout the                            procedure, the patient's blood pressure, pulse, and                            oxygen saturations were monitored continuously. The                            Colonoscope was introduced through the anus and                            advanced to the  the cecum, identified by                            appendiceal orifice and ileocecal valve. The                            colonoscopy was performed without difficulty. The                            patient tolerated the procedure well. The quality                            of the bowel preparation was excellent. The                            ileocecal valve, appendiceal orifice, and rectum                            were photographed. Scope In: 11:05:27 AM Scope Out: 11:39:03 AM Scope Withdrawal Time: 0 hours 29 minutes 30 seconds  Total Procedure Duration: 0 hours 33 minutes 36 seconds  Findings:                 The perianal and digital rectal examinations were                            normal.                           A 10 mm x 58mm polyp was found in the cecum. The                            polyp was multi-lobulated. The polyp was removed                            with a piecemeal technique using a cold snare.                            Resection and retrieval were complete.  Three flat and sessile polyps were found in the                            ascending colon. The polyps were 4 to 8 mm in size.                            These polyps were removed with a cold snare.                            Resection and retrieval were complete.                           A 10 mm x 43mm polyp was found in the distal                            ascending colon. The polyp was sessile. The polyp                            was removed with a cold snare. Resection and                            retrieval were complete.                           Two sessile polyps were found in the rectum and                            descending colon. The polyps were 2 to 6 mm in                            size. These polyps were removed with a cold snare.                            Resection and retrieval were complete.                           The exam was otherwise without  abnormality on                            direct and retroflexion views. Complications:            No immediate complications. Estimated Blood Loss:     Estimated blood loss was minimal. Impression:               - One 10 mm polyp in the cecum, removed piecemeal                            using a cold snare. Resected and retrieved.                           - Three 4 to 8 mm polyps in the ascending colon,  removed with a cold snare. Resected and retrieved.                           - One 10 mm polyp in the distal ascending colon,                            removed with a cold snare. Resected and retrieved.                           - Two 2 to 6 mm polyps in the rectum and in the                            descending colon, removed with a cold snare.                            Resected and retrieved.                           - The examination was otherwise normal on direct                            and retroflexion views. Recommendation:           - Patient has a contact number available for                            emergencies. The signs and symptoms of potential                            delayed complications were discussed with the                            patient. Return to normal activities tomorrow.                            Written discharge instructions were provided to the                            patient.                           - Resume previous diet.                           - Continue present medications.                           - Await pathology results.                           - Repeat colonoscopy is recommended for                            surveillance. The colonoscopy date will be  determined after pathology results from today's                            exam become available for review. Skylyn Slezak L. Loletha Carrow, MD 07/05/2019 11:47:17 AM This report has been signed electronically.

## 2019-07-05 NOTE — Progress Notes (Signed)
Report given to PACU, vss 

## 2019-07-05 NOTE — Progress Notes (Signed)
Pt's states no medical or surgical changes since previsit or office visit.  Temp-Alicia Mcbride  Vital signs-courtney washington

## 2019-07-05 NOTE — Progress Notes (Signed)
Called to room to assist during endoscopic procedure.  Patient ID and intended procedure confirmed with present staff. Received instructions for my participation in the procedure from the performing physician.  

## 2019-07-07 ENCOUNTER — Encounter: Payer: Self-pay | Admitting: Gastroenterology

## 2019-07-07 ENCOUNTER — Telehealth: Payer: Self-pay

## 2019-07-07 NOTE — Telephone Encounter (Signed)
  Follow up Call-  Call back number 07/05/2019  Post procedure Call Back phone  # [phone]  Permission to leave phone message Yes  Some recent data might be hidden     Patient questions:  Do you have a fever, pain , or abdominal swelling? No. Pain Score  0 *  Have you tolerated food without any problems? Yes.    Have you been able to return to your normal activities? Yes.    Do you have any questions about your discharge instructions: Diet   No. Medications  No. Follow up visit  No.  Do you have questions or concerns about your Care? No.  Actions: * If pain score is 4 or above: No action needed, pain <4. 1. Have you developed a fever since your procedure? no  2.   Have you had an respiratory symptoms (SOB or cough) since your procedure? no  3.   Have you tested positive for COVID 19 since your procedure no  4.   Have you had any family members/close contacts diagnosed with the COVID 19 since your procedure?  no   If yes to any of these questions please route to Joylene John, RN and Alphonsa Gin, Therapist, sports.

## 2019-10-10 ENCOUNTER — Encounter: Payer: Self-pay | Admitting: Family Medicine

## 2019-10-11 ENCOUNTER — Other Ambulatory Visit: Payer: Self-pay

## 2019-10-11 DIAGNOSIS — Z20822 Contact with and (suspected) exposure to covid-19: Secondary | ICD-10-CM

## 2019-10-13 LAB — NOVEL CORONAVIRUS, NAA: SARS-CoV-2, NAA: NOT DETECTED

## 2019-12-24 ENCOUNTER — Ambulatory Visit: Payer: PPO | Attending: Internal Medicine

## 2019-12-24 DIAGNOSIS — Z23 Encounter for immunization: Secondary | ICD-10-CM

## 2019-12-24 NOTE — Progress Notes (Signed)
   Covid-19 Vaccination Clinic  Name:  Alicia Mcbride    MRN: [id number] DOB: 26-Feb-1951  12/24/2019  Alicia Mcbride was observed post Covid-19 immunization for 15 minutes without incidence. She was provided with Vaccine Information Sheet and instruction to access the V-Safe system.   Alicia Mcbride was instructed to call 911 with any severe reactions post vaccine: Marland Kitchen Difficulty breathing  . Swelling of your face and throat  . A fast heartbeat  . A bad rash all over your body  . Dizziness and weakness    Immunizations Administered    Name Date Dose VIS Date Route   Pfizer COVID-19 Vaccine 12/24/2019  8:48 AM 0.3 mL 10/21/2019 Intramuscular   Manufacturer: [place]   Lot: [id number]   [place]: [id number]

## 2020-01-17 ENCOUNTER — Ambulatory Visit: Payer: PPO | Attending: Internal Medicine

## 2020-01-17 DIAGNOSIS — Z23 Encounter for immunization: Secondary | ICD-10-CM

## 2020-01-17 NOTE — Progress Notes (Signed)
   Covid-19 Vaccination Clinic  Name:  Alicia Mcbride    MRN: [id number] DOB: 04-03-1951  01/17/2020  Alicia Mcbride was observed post Covid-19 immunization for 15 minutes without incident. She was provided with Vaccine Information Sheet and instruction to access the V-Safe system.   Alicia Mcbride was instructed to call 911 with any severe reactions post vaccine: Marland Kitchen Difficulty breathing  . Swelling of face and throat  . A fast heartbeat  . A bad rash all over body  . Dizziness and weakness   Immunizations Administered    Name Date Dose VIS Date Route   Pfizer COVID-19 Vaccine 01/17/2020  1:56 PM 0.3 mL 10/21/2019 Intramuscular   Manufacturer: [place]   Lot: [id number]   [place]: [id number]

## 2020-02-08 DIAGNOSIS — H2513 Age-related nuclear cataract, bilateral: Secondary | ICD-10-CM | POA: Diagnosis not present

## 2020-03-06 DIAGNOSIS — Z01419 Encounter for gynecological examination (general) (routine) without abnormal findings: Secondary | ICD-10-CM | POA: Diagnosis not present

## 2020-03-06 DIAGNOSIS — Z6827 Body mass index (BMI) 27.0-27.9, adult: Secondary | ICD-10-CM | POA: Diagnosis not present

## 2020-03-06 DIAGNOSIS — Z1231 Encounter for screening mammogram for malignant neoplasm of breast: Secondary | ICD-10-CM | POA: Diagnosis not present

## 2020-03-06 DIAGNOSIS — Z124 Encounter for screening for malignant neoplasm of cervix: Secondary | ICD-10-CM | POA: Diagnosis not present

## 2020-05-07 ENCOUNTER — Other Ambulatory Visit: Payer: Self-pay | Admitting: Family Medicine

## 2020-05-11 ENCOUNTER — Other Ambulatory Visit: Payer: Self-pay | Admitting: Family Medicine

## 2020-05-11 NOTE — Telephone Encounter (Signed)
Pt is scheduled for an appt on July 15th and she leaves tomorrow to go out of town for a week. She is wondering if these medications can be filled until she is able to be seen?   Pt would like a call back at [phone]

## 2020-05-22 ENCOUNTER — Telehealth: Payer: Self-pay | Admitting: *Deleted

## 2020-05-22 MED ORDER — LOSARTAN POTASSIUM 25 MG PO TABS: 25.0000 mg | ORAL_TABLET | Freq: Every day | ORAL | 3 refills | Status: DC

## 2020-05-22 MED ORDER — HYDROCHLOROTHIAZIDE 12.5 MG PO TABS: ORAL_TABLET | ORAL | 3 refills | Status: DC

## 2020-05-22 NOTE — Telephone Encounter (Signed)
Patietn called after hours line. Patient reports she called her pharmacy on June 25 to get her Losartan and her Hydrochlorathiazide refilled. She uses [facility] and the medication has still not been sent in to them. There number is [phone]. Pharmacy has had to give her emergancy medication twice now because the office has not responded. Has an appt w/Dr Sarajane Jews on Thursday the 15th

## 2020-05-22 NOTE — Telephone Encounter (Signed)
Rx has been sent in. Left a detailed message on verified voice mail informing the patient that both prescriptions were sent in.

## 2020-05-24 ENCOUNTER — Ambulatory Visit (INDEPENDENT_AMBULATORY_CARE_PROVIDER_SITE_OTHER): Payer: PPO | Admitting: Family Medicine

## 2020-05-24 ENCOUNTER — Other Ambulatory Visit: Payer: Self-pay

## 2020-05-24 ENCOUNTER — Encounter: Payer: Self-pay | Admitting: Family Medicine

## 2020-05-24 VITALS — BP 110/60 | HR 85 | Temp 98.5°F | Wt 178.8 lb

## 2020-05-24 DIAGNOSIS — Z0000 Encounter for general adult medical examination without abnormal findings: Secondary | ICD-10-CM | POA: Diagnosis not present

## 2020-05-24 NOTE — Progress Notes (Signed)
   Subjective:    Patient ID: Alicia Mcbride, female    DOB: [birth year], 69 y.o.   MRN: [id number]  HPI Here for a well exam. She feels fine.    Review of Systems  Constitutional: Negative.   HENT: Negative.   Eyes: Negative.   Respiratory: Negative.   Cardiovascular: Negative.   Gastrointestinal: Negative.   Genitourinary: Negative for decreased urine volume, difficulty urinating, dyspareunia, dysuria, enuresis, flank pain, frequency, hematuria, pelvic pain and urgency.  Musculoskeletal: Negative.   Skin: Negative.   Neurological: Negative.   Psychiatric/Behavioral: Negative.        Objective:   Physical Exam Constitutional:      General: She is not in acute distress.    Appearance: She is well-developed.  HENT:     Head: Normocephalic and atraumatic.     Right Ear: External ear normal.     Left Ear: External ear normal.     Nose: Nose normal.     Mouth/Throat:     Pharynx: No oropharyngeal exudate.  Eyes:     General: No scleral icterus.    Conjunctiva/sclera: Conjunctivae normal.     Pupils: Pupils are equal, round, and reactive to light.  Neck:     Thyroid: No thyromegaly.     Vascular: No JVD.  Cardiovascular:     Rate and Rhythm: Normal rate and regular rhythm.     Heart sounds: Normal heart sounds. No murmur heard.  No friction rub. No gallop.   Pulmonary:     Effort: Pulmonary effort is normal. No respiratory distress.     Breath sounds: Normal breath sounds. No wheezing or rales.  Chest:     Chest wall: No tenderness.  Abdominal:     General: Bowel sounds are normal. There is no distension.     Palpations: Abdomen is soft. There is no mass.     Tenderness: There is no abdominal tenderness. There is no guarding or rebound.  Musculoskeletal:        General: No tenderness. Normal range of motion.     Cervical back: Normal range of motion and neck supple.  Lymphadenopathy:     Cervical: No cervical adenopathy.  Skin:    General: Skin is warm and dry.       Findings: No erythema or rash.  Neurological:     Mental Status: She is alert and oriented to person, place, and time.     Cranial Nerves: No cranial nerve deficit.     Motor: No abnormal muscle tone.     Coordination: Coordination normal.     Deep Tendon Reflexes: Reflexes are normal and symmetric. Reflexes normal.  Psychiatric:        Behavior: Behavior normal.        Thought Content: Thought content normal.        Judgment: Judgment normal.           Assessment & Plan:  Well exam. We discussed diet and exercise. Get fasting labs. Alicia Penna, MD

## 2020-05-25 LAB — CBC WITH DIFFERENTIAL/PLATELET
Absolute Monocytes: 420 cells/uL (ref 200–950)
Basophils Absolute: 30 cells/uL (ref 0–200)
Basophils Relative: 0.5 %
Eosinophils Absolute: 120 cells/uL (ref 15–500)
Eosinophils Relative: 2 %
HCT: 45.9 % — ABNORMAL HIGH (ref 35.0–45.0)
Hemoglobin: 15.4 g/dL (ref 11.7–15.5)
Lymphs Abs: 1410 cells/uL (ref 850–3900)
MCH: 29.9 pg (ref 27.0–33.0)
MCHC: 33.6 g/dL (ref 32.0–36.0)
MCV: 89.1 fL (ref 80.0–100.0)
MPV: 9.4 fL (ref 7.5–12.5)
Monocytes Relative: 7 %
Neutro Abs: 4020 cells/uL (ref 1500–7800)
Neutrophils Relative %: 67 %
Platelets: 312 10*3/uL (ref 140–400)
RBC: 5.15 10*6/uL — ABNORMAL HIGH (ref 3.80–5.10)
RDW: 13.3 % (ref 11.0–15.0)
Total Lymphocyte: 23.5 %
WBC: 6 10*3/uL (ref 3.8–10.8)

## 2020-05-25 LAB — BASIC METABOLIC PANEL
BUN: 20 mg/dL (ref 7–25)
CO2: 32 mmol/L (ref 20–32)
Calcium: 10.1 mg/dL (ref 8.6–10.4)
Chloride: 101 mmol/L (ref 98–110)
Creat: 0.57 mg/dL (ref 0.50–0.99)
Glucose, Bld: 88 mg/dL (ref 65–99)
Potassium: 4.4 mmol/L (ref 3.5–5.3)
Sodium: 140 mmol/L (ref 135–146)

## 2020-05-25 LAB — HEPATIC FUNCTION PANEL
AG Ratio: 1.8 (calc) (ref 1.0–2.5)
ALT: 16 U/L (ref 6–29)
AST: 17 U/L (ref 10–35)
Albumin: 4.6 g/dL (ref 3.6–5.1)
Alkaline phosphatase (APISO): 80 U/L (ref 37–153)
Bilirubin, Direct: 0.2 mg/dL (ref 0.0–0.2)
Globulin: 2.6 g/dL (calc) (ref 1.9–3.7)
Indirect Bilirubin: 0.6 mg/dL (calc) (ref 0.2–1.2)
Total Bilirubin: 0.8 mg/dL (ref 0.2–1.2)
Total Protein: 7.2 g/dL (ref 6.1–8.1)

## 2020-05-25 LAB — LIPID PANEL
Cholesterol: 241 mg/dL — ABNORMAL HIGH (ref <200)
HDL: 81 mg/dL (ref >=50)
LDL Cholesterol (Calc): 138 mg/dL (calc) — ABNORMAL HIGH
Non-HDL Cholesterol (Calc): 160 mg/dL (calc) — ABNORMAL HIGH (ref <130)
Total CHOL/HDL Ratio: 3 (calc) (ref <5.0)
Triglycerides: 104 mg/dL (ref <150)

## 2020-05-25 LAB — TSH: TSH: 0.17 mIU/L — ABNORMAL LOW (ref 0.40–4.50)

## 2020-06-22 ENCOUNTER — Ambulatory Visit: Payer: PPO

## 2020-08-01 ENCOUNTER — Other Ambulatory Visit: Payer: Self-pay

## 2020-10-11 ENCOUNTER — Telehealth: Payer: Self-pay | Admitting: Family Medicine

## 2020-10-11 NOTE — Telephone Encounter (Signed)
Left message for patient to call back and schedule Medicare Annual Wellness Visit (AWV) either virtually or in office.   Last AWV ; NO PREVIOUS HISTORY please schedule at anytime with [facility] Nurse Health Advisor 1 or 2   This should be a 45 minute visit.

## 2021-02-18 ENCOUNTER — Other Ambulatory Visit: Payer: Self-pay | Admitting: Family Medicine

## 2021-05-06 ENCOUNTER — Other Ambulatory Visit: Payer: Self-pay | Admitting: Family Medicine

## 2021-05-06 NOTE — Telephone Encounter (Signed)
Insurance/pharmacy has requested an alternative for Losartan 25mg .  Alternative are  Telmisartan- 20mg ,40mg ,80mg  Irbestan -75mg , 150mg , 300mg  Valsartan-40mg ,80mg , 160mg , 320mg  Candesartan Cilexetil- 4mg ,8mg ,16mg ,32mg  Olmesartan Modoxomil-5mg , 20mg , 40mg 

## 2021-06-03 ENCOUNTER — Other Ambulatory Visit: Payer: Self-pay

## 2021-06-04 ENCOUNTER — Encounter: Payer: Self-pay | Admitting: Family Medicine

## 2021-06-04 ENCOUNTER — Ambulatory Visit (INDEPENDENT_AMBULATORY_CARE_PROVIDER_SITE_OTHER): Payer: PPO | Admitting: Family Medicine

## 2021-06-04 VITALS — BP 122/78 | HR 64 | Temp 97.8°F | Ht 67.0 in | Wt 192.0 lb

## 2021-06-04 DIAGNOSIS — Z0000 Encounter for general adult medical examination without abnormal findings: Secondary | ICD-10-CM | POA: Diagnosis not present

## 2021-06-04 LAB — CBC WITH DIFFERENTIAL/PLATELET
Basophils Absolute: 0 10*3/uL (ref 0.0–0.1)
Basophils Relative: 0.5 % (ref 0.0–3.0)
Eosinophils Absolute: 0.2 10*3/uL (ref 0.0–0.7)
Eosinophils Relative: 2.4 % (ref 0.0–5.0)
HCT: 43.2 % (ref 36.0–46.0)
Hemoglobin: 14.4 g/dL (ref 12.0–15.0)
Lymphocytes Relative: 23.1 % (ref 12.0–46.0)
Lymphs Abs: 1.5 10*3/uL (ref 0.7–4.0)
MCHC: 33.2 g/dL (ref 30.0–36.0)
MCV: 86 fl (ref 78.0–100.0)
Monocytes Absolute: 0.5 10*3/uL (ref 0.1–1.0)
Monocytes Relative: 7.4 % (ref 3.0–12.0)
Neutro Abs: 4.3 10*3/uL (ref 1.4–7.7)
Neutrophils Relative %: 66.6 % (ref 43.0–77.0)
Platelets: 295 10*3/uL (ref 150.0–400.0)
RBC: 5.03 Mil/uL (ref 3.87–5.11)
RDW: 14.7 % (ref 11.5–15.5)
WBC: 6.4 10*3/uL (ref 4.0–10.5)

## 2021-06-04 LAB — HEPATIC FUNCTION PANEL
ALT: 16 U/L (ref 0–35)
AST: 17 U/L (ref 0–37)
Albumin: 4.4 g/dL (ref 3.5–5.2)
Alkaline Phosphatase: 75 U/L (ref 39–117)
Bilirubin, Direct: 0.1 mg/dL (ref 0.0–0.3)
Total Bilirubin: 0.8 mg/dL (ref 0.2–1.2)
Total Protein: 7.1 g/dL (ref 6.0–8.3)

## 2021-06-04 LAB — BASIC METABOLIC PANEL
BUN: 18 mg/dL (ref 6–23)
CO2: 30 mEq/L (ref 19–32)
Calcium: 10.2 mg/dL (ref 8.4–10.5)
Chloride: 98 mEq/L (ref 96–112)
Creatinine, Ser: 0.66 mg/dL (ref 0.40–1.20)
GFR: 90 mL/min (ref >60.00)
Glucose, Bld: 93 mg/dL (ref 70–99)
Potassium: 4.5 mEq/L (ref 3.5–5.1)
Sodium: 138 mEq/L (ref 135–145)

## 2021-06-04 LAB — LIPID PANEL
Cholesterol: 270 mg/dL — ABNORMAL HIGH (ref 0–200)
HDL: 65.1 mg/dL (ref >39.00)
NonHDL: 204.83
Total CHOL/HDL Ratio: 4
Triglycerides: 227 mg/dL — ABNORMAL HIGH (ref 0.0–149.0)
VLDL: 45.4 mg/dL — ABNORMAL HIGH (ref 0.0–40.0)

## 2021-06-04 LAB — LDL CHOLESTEROL, DIRECT: Direct LDL: 172 mg/dL

## 2021-06-04 LAB — T3, FREE: T3, Free: 3.8 pg/mL (ref 2.3–4.2)

## 2021-06-04 LAB — HEMOGLOBIN A1C: Hgb A1c MFr Bld: 5.4 % (ref 4.6–6.5)

## 2021-06-04 LAB — T4, FREE: Free T4: 0.68 ng/dL (ref 0.60–1.60)

## 2021-06-04 LAB — TSH: TSH: 0.62 u[IU]/mL (ref 0.35–5.50)

## 2021-06-04 NOTE — Addendum Note (Signed)
Addended by: Amanda Cockayne on: 06/04/2021 09:50 AM   Modules accepted: Orders

## 2021-06-04 NOTE — Progress Notes (Signed)
   Subjective:    Patient ID: Alicia Mcbride, female    DOB: Nov 29, 1950, 70 y.o.   MRN: [id number]  HPI Here for a well exam. She feels fine. She exercises 30 minutes almost every day.    Review of Systems  Constitutional: Negative.   HENT: Negative.    Eyes: Negative.   Respiratory: Negative.    Cardiovascular: Negative.   Gastrointestinal: Negative.   Genitourinary:  Negative for decreased urine volume, difficulty urinating, dyspareunia, dysuria, enuresis, flank pain, frequency, hematuria, pelvic pain and urgency.  Musculoskeletal: Negative.   Skin: Negative.   Neurological: Negative.  Negative for headaches.  Psychiatric/Behavioral: Negative.        Objective:   Physical Exam Constitutional:      General: She is not in acute distress.    Appearance: She is well-developed.  HENT:     Head: Normocephalic and atraumatic.     Right Ear: External ear normal.     Left Ear: External ear normal.     Nose: Nose normal.     Mouth/Throat:     Pharynx: No oropharyngeal exudate.  Eyes:     General: No scleral icterus.    Conjunctiva/sclera: Conjunctivae normal.     Pupils: Pupils are equal, round, and reactive to light.  Neck:     Thyroid: No thyromegaly.     Vascular: No JVD.  Cardiovascular:     Rate and Rhythm: Normal rate and regular rhythm.     Heart sounds: Normal heart sounds. No murmur heard.   No friction rub. No gallop.  Pulmonary:     Effort: Pulmonary effort is normal. No respiratory distress.     Breath sounds: Normal breath sounds. No wheezing or rales.  Chest:     Chest wall: No tenderness.  Abdominal:     General: Bowel sounds are normal. There is no distension.     Palpations: Abdomen is soft. There is no mass.     Tenderness: There is no abdominal tenderness. There is no guarding or rebound.  Musculoskeletal:        General: No tenderness. Normal range of motion.     Cervical back: Normal range of motion and neck supple.  Lymphadenopathy:      Cervical: No cervical adenopathy.  Skin:    General: Skin is warm and dry.     Findings: No erythema or rash.  Neurological:     Mental Status: She is alert and oriented to person, place, and time.     Cranial Nerves: No cranial nerve deficit.     Motor: No abnormal muscle tone.     Coordination: Coordination normal.     Deep Tendon Reflexes: Reflexes are normal and symmetric. Reflexes normal.  Psychiatric:        Behavior: Behavior normal.        Thought Content: Thought content normal.        Judgment: Judgment normal.          Assessment & Plan:  Well exam. We discussed diet and exercise. Get fasting labs.  [name], MD

## 2021-06-07 ENCOUNTER — Other Ambulatory Visit: Payer: Self-pay

## 2021-06-07 DIAGNOSIS — E782 Mixed hyperlipidemia: Secondary | ICD-10-CM

## 2021-06-07 MED ORDER — ATORVASTATIN CALCIUM 20 MG PO TABS: 20.0000 mg | ORAL_TABLET | Freq: Every day | ORAL | 3 refills | Status: DC

## 2021-07-25 DIAGNOSIS — Z1231 Encounter for screening mammogram for malignant neoplasm of breast: Secondary | ICD-10-CM | POA: Diagnosis not present

## 2021-07-31 LAB — HM MAMMOGRAPHY

## 2021-08-02 ENCOUNTER — Encounter: Payer: Self-pay | Admitting: Family Medicine

## 2021-10-28 ENCOUNTER — Telehealth: Payer: Self-pay | Admitting: Family Medicine

## 2021-10-28 NOTE — Telephone Encounter (Signed)
Left message for patient to call back and schedule Medicare Annual Wellness Visit (AWV) either virtually or in office. Left  my Alicia Mcbride number [phone]  Patient stated she would call back after 1st of year didn't want to schedule at this time  awvi per [facility] 10/10/12 please schedule at anytime with [facility] Nurse Health Advisor 1 or 2   This should be a 45 minute visit.

## 2021-11-26 DIAGNOSIS — H524 Presbyopia: Secondary | ICD-10-CM | POA: Diagnosis not present

## 2021-11-26 DIAGNOSIS — D23122 Other benign neoplasm of skin of left lower eyelid, including canthus: Secondary | ICD-10-CM | POA: Diagnosis not present

## 2021-11-26 DIAGNOSIS — H43813 Vitreous degeneration, bilateral: Secondary | ICD-10-CM | POA: Diagnosis not present

## 2021-11-26 DIAGNOSIS — H2513 Age-related nuclear cataract, bilateral: Secondary | ICD-10-CM | POA: Diagnosis not present

## 2021-12-10 DIAGNOSIS — J00 Acute nasopharyngitis [common cold]: Secondary | ICD-10-CM | POA: Diagnosis not present

## 2022-01-09 ENCOUNTER — Encounter: Payer: Self-pay | Admitting: Family Medicine

## 2022-01-09 ENCOUNTER — Ambulatory Visit (INDEPENDENT_AMBULATORY_CARE_PROVIDER_SITE_OTHER): Payer: PPO | Admitting: Family Medicine

## 2022-01-09 VITALS — BP 123/69 | HR 87 | Temp 98.8°F | Wt 205.4 lb

## 2022-01-09 DIAGNOSIS — R052 Subacute cough: Secondary | ICD-10-CM | POA: Diagnosis not present

## 2022-01-09 DIAGNOSIS — R0982 Postnasal drip: Secondary | ICD-10-CM

## 2022-01-09 DIAGNOSIS — J309 Allergic rhinitis, unspecified: Secondary | ICD-10-CM

## 2022-01-09 MED ORDER — CETIRIZINE HCL 10 MG PO TABS: 10.0000 mg | ORAL_TABLET | Freq: Every day | ORAL | 0 refills | Status: DC

## 2022-01-09 MED ORDER — FLUTICASONE PROPIONATE 50 MCG/ACT NA SUSP: 2.0000 | Freq: Every day | NASAL | 0 refills | Status: DC

## 2022-01-09 NOTE — Progress Notes (Signed)
Subjective:  ? ? Patient ID: Alicia Mcbride, female    DOB: May 24, 1951, 71 y.o.   MRN: [id number] ? ?Chief Complaint  ?Patient presents with  ? Cough  ?  For 1 month, went to UC and was given something, but has not relieved the cough. Throat is phlemmy, but also scratchy.     ? ? ?HPI ?Patient was seen today for ongoing concern.  Patient endorses cough/cold symptoms x1 month.  States COVID testing has been negative.  Patient seen at urgent care at the end of January for similar symptoms.  Given cough syrup and Flonase.  Patient notes return of symptoms in the last week.  Symptoms include dry cough at times productive, scratchy throat, and congestion.  Patient denies fever, chills, headaches, facial pain, facial pressure, ear pain, ear pressure.  Patient notes history of allergies as a child requiring allergy shots. ? ?Past Medical History:  ?Diagnosis Date  ? Acute cholecystitis 01/04/2018  ? Allergy   ? Arthritis   ? Cataract   ? forming   ? Essential hypertension 07/23/2007  ? Qualifier: Diagnosis of  By: Rogue Bussing CMA, [name]    ? Gynecological examination   ? sees Dr. Alden Hipp  ? Hyperlipemia, mixed 07/23/2007  ? Qualifier: Diagnosis of  By: Rogue Bussing CMA, [name]    ? Hypertension   ? PONV (postoperative nausea and vomiting)   ? ? ?No Known Allergies ? ?ROS ?General: Denies fever, chills, night sweats, changes in weight, changes in appetite ?HEENT: Denies headaches, ear pain, changes in vision, rhinorrhea  + scratchy throat ?CV: Denies CP, palpitations, SOB, orthopnea ?Pulm: Denies SOB, wheezing  + cough ?GI: Denies abdominal pain, nausea, vomiting, diarrhea, constipation ?GU: Denies dysuria, hematuria, frequency, vaginal discharge ?Msk: Denies muscle cramps, joint pains ?Neuro: Denies weakness, numbness, tingling ?Skin: Denies rashes, bruising ?Psych: Denies depression, anxiety, hallucinations ? ?   ?Objective:  ?  ?Blood pressure 123/69, pulse 87, temperature 98.8 ?F (37.1 ?C), temperature source Oral,  weight 205 lb 6.4 oz (93.2 kg), SpO2 98 %. ? ?Gen. Pleasant, well-nourished, in no distress, normal affect   ?HEENT: [place]/AT, face symmetric, conjunctiva clear, no scleral icterus, PERRLA, EOMI, nares patent without drainage, pharynx with clear postnasal drainage, mild erythema, and no exudate.  TMs normal bilaterally.  No cervical lymphadenopathy. ?Lungs: no accessory muscle use, CTAB, no wheezes or rales ?Cardiovascular: RRR, no m/r/g, no peripheral edema ?Musculoskeletal: No deformities, no cyanosis or clubbing, normal tone ?Neuro:  A&Ox3, CN II-XII intact, normal gait ? ?Wt Readings from Last 3 Encounters:  ?01/09/22 205 lb 6.4 oz (93.2 kg)  ?06/04/21 192 lb (87.1 kg)  ?05/24/20 178 lb 12.8 oz (81.1 kg)  ? ? ?Lab Results  ?Component Value Date  ? WBC 6.4 06/04/2021  ? HGB 14.4 06/04/2021  ? HCT 43.2 06/04/2021  ? PLT 295.0 06/04/2021  ? GLUCOSE 93 06/04/2021  ? CHOL 270 (H) 06/04/2021  ? TRIG 227.0 (H) 06/04/2021  ? HDL 65.10 06/04/2021  ? LDLDIRECT 172.0 06/04/2021  ? LDLCALC 138 (H) 05/24/2020  ? ALT 16 06/04/2021  ? AST 17 06/04/2021  ? NA 138 06/04/2021  ? K 4.5 06/04/2021  ? CL 98 06/04/2021  ? CREATININE 0.66 06/04/2021  ? BUN 18 06/04/2021  ? CO2 30 06/04/2021  ? TSH 0.62 06/04/2021  ? INR 1.31 11/16/2017  ? HGBA1C 5.4 06/04/2021  ? ? ?Assessment/Plan: ? ?Post-nasal drainage - Plan: fluticasone (FLONASE) 50 MCG/ACT nasal spray, cetirizine (ZYRTEC) 10 MG tablet ? ?Allergic rhinitis, unspecified seasonality, unspecified  trigger - Plan: fluticasone (FLONASE) 50 MCG/ACT nasal spray, cetirizine (ZYRTEC) 10 MG tablet ? ?Subacute cough ? ?Current symptoms 2/2 allergic rhinitis with postnasal drainage causing throat irritation and cough.  Discussed antihistamines and nasal spray to help reduce drainage.  Rx for Zyrtec and Flonase sent to pharmacy though OTC.  Can continue other supportive care including saline nasal rinse, OTC cough syrup/drops, warm fluids, steam from shower. ? ?F/u as needed with PCP. ? ?Grier Mitts, MD ?

## 2022-01-23 DIAGNOSIS — H25041 Posterior subcapsular polar age-related cataract, right eye: Secondary | ICD-10-CM | POA: Diagnosis not present

## 2022-01-23 DIAGNOSIS — H25811 Combined forms of age-related cataract, right eye: Secondary | ICD-10-CM | POA: Diagnosis not present

## 2022-01-23 DIAGNOSIS — H2511 Age-related nuclear cataract, right eye: Secondary | ICD-10-CM | POA: Diagnosis not present

## 2022-01-23 DIAGNOSIS — H25011 Cortical age-related cataract, right eye: Secondary | ICD-10-CM | POA: Diagnosis not present

## 2022-01-28 ENCOUNTER — Ambulatory Visit (INDEPENDENT_AMBULATORY_CARE_PROVIDER_SITE_OTHER): Payer: PPO | Admitting: Family Medicine

## 2022-01-28 ENCOUNTER — Encounter: Payer: Self-pay | Admitting: Family Medicine

## 2022-01-28 VITALS — BP 118/68 | HR 86 | Temp 98.2°F | Wt 205.0 lb

## 2022-01-28 DIAGNOSIS — J3089 Other allergic rhinitis: Secondary | ICD-10-CM | POA: Insufficient documentation

## 2022-01-28 DIAGNOSIS — H6981 Other specified disorders of Eustachian tube, right ear: Secondary | ICD-10-CM | POA: Diagnosis not present

## 2022-01-28 DIAGNOSIS — H6991 Unspecified Eustachian tube disorder, right ear: Secondary | ICD-10-CM

## 2022-01-28 MED ORDER — METHYLPREDNISOLONE ACETATE 80 MG/ML IJ SUSP
80.0000 mg | Freq: Once | INTRAMUSCULAR | Status: AC
  Administered 2022-01-28: 80 mg via INTRAMUSCULAR

## 2022-01-28 MED ORDER — METHYLPREDNISOLONE ACETATE 40 MG/ML IJ SUSP
40.0000 mg | Freq: Once | INTRAMUSCULAR | Status: AC
  Administered 2022-01-28: 40 mg via INTRAMUSCULAR

## 2022-01-28 NOTE — Addendum Note (Signed)
Addended by: Wyvonne Lenz on: 01/28/2022 04:31 PM ? ? Modules accepted: Orders ? ?

## 2022-01-28 NOTE — Progress Notes (Signed)
? ?  Subjective:  ? ? Patient ID: Alicia Mcbride, female    DOB: [birth year], 71 y.o.   MRN: [id number] ? ?HPI ?Here for problems in the right ear that started one week ago. She was seen here on 01-09-22 for PND and a dry cough. This was felt to be due to allergies, and she was advised to use Claritin and Flonase every day. She only did this for 2 days however and then she stopped. The cough improved, but one week ago she developed pain in the right ear and she lost most of her hearing on that side. No fever or sinus congestion. She has a supply of Amoxicillin at home to use for dental procedures, and she took this BID for 3 days. Then the ear pain resolved, but she still has a hearing issue.  ? ? ?Review of Systems  ?Constitutional: Negative.   ?HENT:  Positive for hearing loss and postnasal drip. Negative for congestion, ear pain, sinus pressure and sore throat.   ?Eyes: Negative.   ?Respiratory: Negative.    ? ?   ?Objective:  ? Physical Exam ?Constitutional:   ?   Appearance: Normal appearance. She is not ill-appearing.  ?HENT:  ?   Right Ear: Ear canal and external ear normal. There is no impacted cerumen.  ?   Left Ear: Tympanic membrane, ear canal and external ear normal. There is no impacted cerumen.  ?   Ears:  ?   Comments: Right TM is clear and mildly retracted  ?   Nose: Nose normal.  ?   Mouth/Throat:  ?   Pharynx: Oropharynx is clear.  ?Eyes:  ?   Conjunctiva/sclera: Conjunctivae normal.  ?Pulmonary:  ?   Effort: Pulmonary effort is normal.  ?   Breath sounds: Normal breath sounds.  ?Lymphadenopathy:  ?   Cervical: No cervical adenopathy.  ?Neurological:  ?   Mental Status: She is alert.  ? ? ? ? ? ?   ?Assessment & Plan:  ?She is experiencing eustachian tube dysfunction on the right side, and this is the result of allergies. I advised to get back on daily Claritin and Flonase, at least until the spring allergy season is over. She is also given a shot of DepoMedrol today. Recheck as needed.  ?[name],  MD ? ? ?

## 2022-02-06 DIAGNOSIS — H2512 Age-related nuclear cataract, left eye: Secondary | ICD-10-CM | POA: Diagnosis not present

## 2022-02-06 DIAGNOSIS — H25812 Combined forms of age-related cataract, left eye: Secondary | ICD-10-CM | POA: Diagnosis not present

## 2022-02-06 DIAGNOSIS — H25042 Posterior subcapsular polar age-related cataract, left eye: Secondary | ICD-10-CM | POA: Diagnosis not present

## 2022-02-06 DIAGNOSIS — H25012 Cortical age-related cataract, left eye: Secondary | ICD-10-CM | POA: Diagnosis not present

## 2022-02-19 ENCOUNTER — Other Ambulatory Visit: Payer: Self-pay | Admitting: Family Medicine

## 2022-04-10 ENCOUNTER — Ambulatory Visit: Payer: PPO

## 2022-04-21 ENCOUNTER — Ambulatory Visit (INDEPENDENT_AMBULATORY_CARE_PROVIDER_SITE_OTHER): Payer: PPO

## 2022-04-21 VITALS — Ht 67.5 in | Wt 195.0 lb

## 2022-04-21 DIAGNOSIS — Z0000 Encounter for general adult medical examination without abnormal findings: Secondary | ICD-10-CM | POA: Diagnosis not present

## 2022-04-21 NOTE — Patient Instructions (Signed)
Alicia Mcbride , Thank you for taking time to come for your Medicare Wellness Visit. I appreciate your ongoing commitment to your health goals. Please review the following plan we discussed and let me know if I can assist you in the future.   Screening recommendations/referrals: Colonoscopy: completed 07/05/2019, due 07/04/2022 Mammogram: completed 07/31/2021, due 08/01/2022 Bone Density: due, will speak with Gynecologist Recommended yearly ophthalmology/optometry visit for glaucoma screening and checkup Recommended yearly dental visit for hygiene and checkup  Vaccinations: Influenza vaccine: due 06/10/2022 Pneumococcal vaccine: due Tdap vaccine: due Shingles vaccine: discussed   Covid-19: 01/17/2020, 12/24/2019  Advanced directives: Advance directive discussed with you today.   Conditions/risks identified: none  Next appointment: Follow up in one year for your annual wellness visit    Preventive Care 65 Years and Older, Female Preventive care refers to lifestyle choices and visits with your health care provider that can promote health and wellness. What does preventive care include? A yearly physical exam. This is also called an annual well check. Dental exams once or twice a year. Routine eye exams. Ask your health care provider how often you should have your eyes checked. Personal lifestyle choices, including: Daily care of your teeth and gums. Regular physical activity. Eating a healthy diet. Avoiding tobacco and drug use. Limiting alcohol use. Practicing safe sex. Taking low-dose aspirin every day. Taking vitamin and mineral supplements as recommended by your health care provider. What happens during an annual well check? The services and screenings done by your health care provider during your annual well check will depend on your age, overall health, lifestyle risk factors, and family history of disease. Counseling  Your health care provider may ask you questions about  your: Alcohol use. Tobacco use. Drug use. Emotional well-being. Home and relationship well-being. Sexual activity. Eating habits. History of falls. Memory and ability to understand (cognition). Work and work Statistician. Reproductive health. Screening  You may have the following tests or measurements: Height, weight, and BMI. Blood pressure. Lipid and cholesterol levels. These may be checked every 5 years, or more frequently if you are over 10 years old. Skin check. Lung cancer screening. You may have this screening every year starting at age 70 if you have a 30-pack-year history of smoking and currently smoke or have quit within the past 15 years. Fecal occult blood test (FOBT) of the stool. You may have this test every year starting at age 70. Flexible sigmoidoscopy or colonoscopy. You may have a sigmoidoscopy every 5 years or a colonoscopy every 10 years starting at age 70. Hepatitis C blood test. Hepatitis B blood test. Sexually transmitted disease (STD) testing. Diabetes screening. This is done by checking your blood sugar (glucose) after you have not eaten for a while (fasting). You may have this done every 1-3 years. Bone density scan. This is done to screen for osteoporosis. You may have this done starting at age 70. Mammogram. This may be done every 1-2 years. Talk to your health care provider about how often you should have regular mammograms. Talk with your health care provider about your test results, treatment options, and if necessary, the need for more tests. Vaccines  Your health care provider may recommend certain vaccines, such as: Influenza vaccine. This is recommended every year. Tetanus, diphtheria, and acellular pertussis (Tdap, Td) vaccine. You may need a Td booster every 10 years. Zoster vaccine. You may need this after age 70. Pneumococcal 13-valent conjugate (PCV13) vaccine. One dose is recommended after age 70. Pneumococcal polysaccharide (PPSV23) vaccine.  One dose is recommended after age 70. Talk to your health care provider about which screenings and vaccines you need and how often you need them. This information is not intended to replace advice given to you by your health care provider. Make sure you discuss any questions you have with your health care provider. Document Released: 11/23/2015 Document Revised: 07/16/2016 Document Reviewed: 08/28/2015 Elsevier Interactive Patient Education  2017 [employer] Prevention in the Home Falls can cause injuries. They can happen to people of all ages. There are many things you can do to make your home safe and to help prevent falls. What can I do on the outside of my home? Regularly fix the edges of walkways and driveways and fix any cracks. Remove anything that might make you trip as you walk through a door, such as a raised step or threshold. Trim any bushes or trees on the path to your home. Use bright outdoor lighting. Clear any walking paths of anything that might make someone trip, such as rocks or tools. Regularly check to see if handrails are loose or broken. Make sure that both sides of any steps have handrails. Any raised decks and porches should have guardrails on the edges. Have any leaves, snow, or ice cleared regularly. Use sand or salt on walking paths during winter. Clean up any spills in your garage right away. This includes oil or grease spills. What can I do in the bathroom? Use night lights. Install grab bars by the toilet and in the tub and shower. Do not use towel bars as grab bars. Use non-skid mats or decals in the tub or shower. If you need to sit down in the shower, use a plastic, non-slip stool. Keep the floor dry. Clean up any water that spills on the floor as soon as it happens. Remove soap buildup in the tub or shower regularly. Attach bath mats securely with double-sided non-slip rug tape. Do not have throw rugs and other things on the floor that can make  you trip. What can I do in the bedroom? Use night lights. Make sure that you have a light by your bed that is easy to reach. Do not use any sheets or blankets that are too big for your bed. They should not hang down onto the floor. Have a firm chair that has side arms. You can use this for support while you get dressed. Do not have throw rugs and other things on the floor that can make you trip. What can I do in the kitchen? Clean up any spills right away. Avoid walking on wet floors. Keep items that you use a lot in easy-to-reach places. If you need to reach something above you, use a strong step stool that has a grab bar. Keep electrical cords out of the way. Do not use floor polish or wax that makes floors slippery. If you must use wax, use non-skid floor wax. Do not have throw rugs and other things on the floor that can make you trip. What can I do with my stairs? Do not leave any items on the stairs. Make sure that there are handrails on both sides of the stairs and use them. Fix handrails that are broken or loose. Make sure that handrails are as long as the stairways. Check any carpeting to make sure that it is firmly attached to the stairs. Fix any carpet that is loose or worn. Avoid having throw rugs at the top or bottom of the  stairs. If you do have throw rugs, attach them to the floor with carpet tape. Make sure that you have a light switch at the top of the stairs and the bottom of the stairs. If you do not have them, ask someone to add them for you. What else can I do to help prevent falls? Wear shoes that: Do not have high heels. Have rubber bottoms. Are comfortable and fit you well. Are closed at the toe. Do not wear sandals. If you use a stepladder: Make sure that it is fully opened. Do not climb a closed stepladder. Make sure that both sides of the stepladder are locked into place. Ask someone to hold it for you, if possible. Clearly mark and make sure that you can  see: Any grab bars or handrails. First and last steps. Where the edge of each step is. Use tools that help you move around (mobility aids) if they are needed. These include: Canes. Walkers. Scooters. Crutches. Turn on the lights when you go into a dark area. Replace any light bulbs as soon as they burn out. Set up your furniture so you have a clear path. Avoid moving your furniture around. If any of your floors are uneven, fix them. If there are any pets around you, be aware of where they are. Review your medicines with your doctor. Some medicines can make you feel dizzy. This can increase your chance of falling. Ask your doctor what other things that you can do to help prevent falls. This information is not intended to replace advice given to you by your health care provider. Make sure you discuss any questions you have with your health care provider. Document Released: 08/23/2009 Document Revised: 04/03/2016 Document Reviewed: 12/01/2014 Elsevier Interactive Patient Education  2017 [employer].

## 2022-04-21 NOTE — Progress Notes (Signed)
I connected with Alicia Mcbride today by telephone and verified that I am speaking with the correct person using two identifiers. Location patient: home Location provider: work Persons participating in the virtual visit: Alicia, Shiflett LPN.   I discussed the limitations, risks, security and privacy concerns of performing an evaluation and management service by telephone and the availability of in person appointments. I also discussed with the patient that there may be a patient responsible charge related to this service. The patient expressed understanding and verbally consented to this telephonic visit.    Interactive audio and video telecommunications were attempted between this provider and patient, however failed, due to patient having technical difficulties OR patient did not have access to video capability.  We continued and completed visit with audio only.     Vital signs may be patient reported or missing.  Subjective:   Alicia Mcbride is a 71 y.o. female who presents for an Initial Medicare Annual Wellness Visit.  Review of Systems     Cardiac Risk Factors include: advanced age (>85mn, >>11women);dyslipidemia;hypertension;obesity (BMI >30kg/m2)     Objective:    Today's Vitals   04/21/22 1556  Weight: 195 lb (88.5 kg)  Height: 5' 7.5" (1.715 m)   Body mass index is 30.09 kg/m.     04/21/2022    4:04 PM 01/04/2018    3:29 AM 11/30/2017    4:10 PM 11/11/2017    4:58 PM 11/04/2017    1:30 PM  Advanced Directives  Does Patient Have a Medical Advance Directive? No Yes Yes No No  Type of ACorporate treasurerof [place]Living will [place] will    Does patient want to make changes to medical advance directive?  No - Patient declined No - Patient declined    Copy of [place] Chart?   No - copy requested    Would patient like information on creating a medical advance directive?    No - Patient  declined Yes (MAU/Ambulatory/Procedural Areas - Information given)    Current Medications (verified) Outpatient Encounter Medications as of 04/21/2022  Medication Sig   atorvastatin (LIPITOR) 20 MG tablet Take 1 tablet (20 mg total) by mouth daily.   cholecalciferol (VITAMIN D3) 25 MCG (1000 UT) tablet Take 2,000 Units by mouth daily.   Doxylamine Succinate, Sleep, (SLEEP AID PO) Take by mouth.   fluticasone (FLONASE) 50 MCG/ACT nasal spray Place 2 sprays into both nostrils daily.   hydrochlorothiazide (HYDRODIURIL) 12.5 MG tablet TAKE 1 TABLET BY MOUTH DAILY (ALONG WITHLOSARTAN)   ibuprofen (ADVIL) 600 MG tablet ibuprofen 600 mg tablet   losartan (COZAAR) 25 MG tablet TAKE ONE TABLET (25 MG) BY MOUTH EVERY DAY   Magnesium 400 MG CAPS Take by mouth.   MEGARED OMEGA-3 KRILL OIL PO Take by mouth. (Patient not taking: Reported on 04/21/2022)   No facility-administered encounter medications on file as of 04/21/2022.    Allergies (verified) Patient has no known allergies.   History: Past Medical History:  Diagnosis Date   Acute cholecystitis 01/04/2018   Allergy    Arthritis    Cataract    forming    Essential hypertension 07/23/2007   Qualifier: Diagnosis of  By: CRogue BussingCMA, [facility]    Gynecological examination    sees Dr. RAlden Hipp  Hyperlipemia, mixed 07/23/2007   Qualifier: Diagnosis of  By: CRogue BussingCMA, [name]     Hypertension    PONV (postoperative nausea and vomiting)  Past Surgical History:  Procedure Laterality Date   cataract surgery Bilateral    01/2022   CHOLECYSTECTOMY N/A 01/04/2018   Procedure: LAPAROSCOPIC CHOLECYSTECTOMY;  Surgeon: Olean Ree, MD;  Location: [facility] ORS;  Service: General;  Laterality: N/A;   COLONOSCOPY  07/05/2019   per Dr. Loletha Carrow, adeomatous and serrated sessile polyps, repeat in 3 yrs    TONSILLECTOMY     TOTAL KNEE ARTHROPLASTY Bilateral 11/11/2017   Procedure: TOTAL KNEE BILATERAL;  Surgeon: Gaynelle Arabian, MD;  Location:  [facility] OR[facility];  Service: Orthopedics;  Laterality: Bilateral;   TUBAL LIGATION  1985   Family History  Problem Relation Age of Onset   Hypertension Mother    Thyroid disease Father    Colon polyps Neg Hx    Colon cancer Neg Hx    Esophageal cancer Neg Hx    Rectal cancer Neg Hx    Stomach cancer Neg Hx    Social History   Socioeconomic History   Marital status: Married    Spouse name: Not on file   Number of children: Not on file   Years of education: Not on file   Highest education level: Not on file  Occupational History   Not on file  Tobacco Use   Smoking status: Never   Smokeless tobacco: Never  Vaping Use   Vaping Use: Never used  Substance and Sexual Activity   Alcohol use: Yes    Alcohol/week: 8.0 standard drinks of alcohol    Types: 8 Glasses of wine per week   Drug use: No   Sexual activity: Yes  Other Topics Concern   Not on file  Social History Narrative   Not on file   Social Determinants of Health   Financial Resource Strain: Low Risk  (04/21/2022)   Overall Financial Resource Strain (CARDIA)    Difficulty of Paying Living Expenses: Not hard at all  Food Insecurity: No Food Insecurity (04/21/2022)   Hunger Vital Sign    Worried About Running Out of Food in the Last Year: Never true    Ran Out of Food in the Last Year: Never true  Transportation Needs: No Transportation Needs (04/21/2022)   PRAPARE - Hydrologist (Medical): No    Lack of Transportation (Non-Medical): No  Physical Activity: Inactive (04/21/2022)   Exercise Vital Sign    Days of Exercise per Week: 0 days    Minutes of Exercise per Session: 0 min  Stress: Stress Concern Present (04/21/2022)   [place]    Feeling of Stress : To some extent  Social Connections: Not on file    Tobacco Counseling Counseling given: Not Answered   Clinical Intake:  Pre-visit preparation completed: Yes  Pain :  No/denies pain     Nutritional Status: BMI > 30  Obese Nutritional Risks: None Diabetes: No  How often do you need to have someone help you when you read instructions, pamphlets, or other written materials from your doctor or pharmacy?: 1 - Never What is the last grade level you completed in school?: bachelors degree  Diabetic? no  Interpreter Needed?: No  Information entered by :: NAllen LPN   Activities of Daily Living    04/21/2022    4:05 PM  In your present state of health, do you have any difficulty performing the following activities:  Hearing? 0  Comment wears hearing aids  Vision? 0  Difficulty concentrating or making decisions? 0  Walking or  climbing stairs? 0  Dressing or bathing? 0  Doing errands, shopping? 0  Preparing Food and eating ? N  Using the Toilet? N  In the past six months, have you accidently leaked urine? Y  Do you have problems with loss of bowel control? N  Managing your Medications? N  Managing your Finances? N  Housekeeping or managing your Housekeeping? N    Patient Care Team: Laurey Morale, MD as PCP - General Alden Hipp, MD (Obstetrics and Gynecology)  Indicate any recent Medical Services you may have received from other than [facility] providers in the past year (date may be approximate).     Assessment:   This is a routine wellness examination for Markesia.  Hearing/Vision screen Vision Screening - Comments:: Regular eye exams, [facility]  Dietary issues and exercise activities discussed: Current Exercise Habits: The patient does not participate in regular exercise at present   Goals Addressed             This Visit's Progress    Patient Stated       04/21/2022, wants to lose 20 pounds       Depression Screen    04/21/2022    4:05 PM 01/28/2022    2:08 PM 01/09/2022    1:48 PM 10/27/2017    2:43 PM  PHQ 2/9 Scores  PHQ - 2 Score 0 1 2 0  PHQ- 9 Score  3 3     Fall Risk    04/21/2022    4:05 PM 01/28/2022     2:08 PM 01/09/2022    1:48 PM 06/13/2019    9:45 AM 10/27/2017    2:43 PM  [place] in the past year? 0 0 1  Yes  Comment    Emmi Telephone Survey: data to providers prior to load   Number falls in past yr: 0 0 1  1  Comment    Emmi Telephone Survey Actual Response =    Injury with Fall? 0 0 0  No  Risk for fall due to : Medication side effect No Fall Risks     Follow up Falls evaluation completed;Education provided;Falls prevention discussed        FALL RISK PREVENTION PERTAINING TO THE HOME:  Any stairs in or around the home? Yes  If so, are there any without handrails? No  Home free of loose throw rugs in walkways, pet beds, electrical cords, etc? Yes  Adequate lighting in your home to reduce risk of falls? Yes   ASSISTIVE DEVICES UTILIZED TO PREVENT FALLS:  Life alert? No  Use of a cane, walker or w/c? No  Grab bars in the bathroom? No  Shower chair or bench in shower? Yes  Elevated toilet seat or a handicapped toilet? No   TIMED UP AND GO:  Was the test performed? No .       Cognitive Function:        04/21/2022    4:07 PM  6CIT Screen  What Year? 0 points  What month? 0 points  What time? 0 points  Count back from 20 0 points  Months in reverse 0 points  Repeat phrase 0 points  Total Score 0 points    Immunizations Immunization History  Administered Date(s) Administered   Influenza Split 09/25/2015   Influenza Whole 10/17/2009   Influenza,inj,Quad PF,6+ Mos 09/20/2016, 09/11/2018   Influenza-Unspecified 09/30/2017   PFIZER(Purple Top)SARS-COV-2 Vaccination 12/24/2019, 01/17/2020   Zoster, Live 01/28/2015    TDAP  status: Due, Education has been provided regarding the importance of this vaccine. Advised may receive this vaccine at local pharmacy or Health Dept. Aware to provide a copy of the vaccination record if obtained from local pharmacy or Health Dept. Verbalized acceptance and understanding.  Flu Vaccine status: Up to  date  Pneumococcal vaccine status: Due, Education has been provided regarding the importance of this vaccine. Advised may receive this vaccine at local pharmacy or Health Dept. Aware to provide a copy of the vaccination record if obtained from local pharmacy or Health Dept. Verbalized acceptance and understanding.  Covid-19 vaccine status: Completed vaccines  Qualifies for Shingles Vaccine? Yes   Zostavax completed Yes   Shingrix Completed?: No.    Education has been provided regarding the importance of this vaccine. Patient has been advised to call insurance company to determine out of pocket expense if they have not yet received this vaccine. Advised may also receive vaccine at local pharmacy or Health Dept. Verbalized acceptance and understanding.  Screening Tests Health Maintenance  Topic Date Due   Hepatitis C Screening  Never done   TETANUS/TDAP  Never done   Zoster Vaccines- Shingrix (1 of 2) Never done   Pneumonia Vaccine 97+ Years old (1 - PCV) Never done   DEXA SCAN  Never done   COVID-19 Vaccine (3 - Pfizer series) 03/13/2020   INFLUENZA VACCINE  06/10/2022   COLONOSCOPY (Pts 45-90yr Insurance coverage will need to be confirmed)  07/04/2022   MAMMOGRAM  08/01/2023   HPV VACCINES  Aged Out    Health Maintenance  Health Maintenance Due  Topic Date Due   Hepatitis C Screening  Never done   TETANUS/TDAP  Never done   Zoster Vaccines- Shingrix (1 of 2) Never done   Pneumonia Vaccine 70 Years old (1 - PCV) Never done   DEXA SCAN  Never done   COVID-19 Vaccine (3 - Pfizer series) 03/13/2020    Colorectal cancer screening: Type of screening: Colonoscopy. Completed 07/05/2019. Repeat every 3 years  Mammogram status: Completed 07/31/2021. Repeat every year  Bone Density status: due  Lung Cancer Screening: (Low Dose CT Chest recommended if Age 70-80years, 30 pack-year currently smoking OR have quit w/in 15years.) does not qualify.   Lung Cancer Screening Referral:  no  Additional Screening:  Hepatitis C Screening: does qualify;   Vision Screening: Recommended annual ophthalmology exams for early detection of glaucoma and other disorders of the eye. Is the patient up to date with their annual eye exam?  Yes  Who is the provider or what is the name of the office in which the patient attends annual [facility] exams[facility] provider, would they like to be referred to a provider to establish care? No .   Dental Screening: Recommended annual dental exams for proper oral hygiene  Community Resource Referral / Chronic Care Management: CRR required this visit?  No   CCM required this visit?  No      Plan:     I have personally reviewed and noted the following in the patient's chart:   Medical and social history Use of alcohol, tobacco or illicit drugs  Current medications and supplements including opioid prescriptions. Patient is not currently taking opioid prescriptions. Functional ability and status Nutritional status Physical activity Advanced directives List of other physicians Hospitalizations, surgeries, and ER visits in previous 12 months Vitals Screenings to include cognitive, depression, and falls Referrals and appointments  In addition, I have  reviewed and discussed with patient certain preventive protocols, quality metrics, and best practice recommendations. A written personalized care plan for preventive services as well as general preventive health recommendations were provided to patient.     Kellie Simmering, LPN   9/77/4142   Nurse Notes: none  Due to this being a virtual visit, the after visit summary with patients personalized plan was offered to patient via mail or my-chart. Patient would like to access on my-chart

## 2022-04-23 ENCOUNTER — Other Ambulatory Visit: Payer: Self-pay | Admitting: Ophthalmology

## 2022-04-23 DIAGNOSIS — L820 Inflamed seborrheic keratosis: Secondary | ICD-10-CM | POA: Diagnosis not present

## 2022-05-15 DIAGNOSIS — D2261 Melanocytic nevi of right upper limb, including shoulder: Secondary | ICD-10-CM | POA: Diagnosis not present

## 2022-05-15 DIAGNOSIS — L821 Other seborrheic keratosis: Secondary | ICD-10-CM | POA: Diagnosis not present

## 2022-05-15 DIAGNOSIS — D225 Melanocytic nevi of trunk: Secondary | ICD-10-CM | POA: Diagnosis not present

## 2022-05-15 DIAGNOSIS — D2272 Melanocytic nevi of left lower limb, including hip: Secondary | ICD-10-CM | POA: Diagnosis not present

## 2022-05-15 DIAGNOSIS — D2262 Melanocytic nevi of left upper limb, including shoulder: Secondary | ICD-10-CM | POA: Diagnosis not present

## 2022-05-15 DIAGNOSIS — L304 Erythema intertrigo: Secondary | ICD-10-CM | POA: Diagnosis not present

## 2022-05-15 DIAGNOSIS — D2271 Melanocytic nevi of right lower limb, including hip: Secondary | ICD-10-CM | POA: Diagnosis not present

## 2022-05-31 ENCOUNTER — Other Ambulatory Visit: Payer: Self-pay | Admitting: Family Medicine

## 2022-06-12 ENCOUNTER — Encounter: Payer: PPO | Admitting: Family Medicine

## 2022-06-25 ENCOUNTER — Ambulatory Visit (INDEPENDENT_AMBULATORY_CARE_PROVIDER_SITE_OTHER): Payer: PPO | Admitting: Family Medicine

## 2022-06-25 ENCOUNTER — Ambulatory Visit (INDEPENDENT_AMBULATORY_CARE_PROVIDER_SITE_OTHER): Payer: PPO

## 2022-06-25 ENCOUNTER — Encounter: Payer: Self-pay | Admitting: Family Medicine

## 2022-06-25 VITALS — BP 110/76 | HR 67 | Temp 97.6°F | Ht 67.0 in | Wt 203.0 lb

## 2022-06-25 DIAGNOSIS — M79672 Pain in left foot: Secondary | ICD-10-CM

## 2022-06-25 DIAGNOSIS — Z23 Encounter for immunization: Secondary | ICD-10-CM

## 2022-06-25 DIAGNOSIS — M7732 Calcaneal spur, left foot: Secondary | ICD-10-CM | POA: Diagnosis not present

## 2022-06-25 DIAGNOSIS — Z86010 Personal history of colonic polyps: Secondary | ICD-10-CM | POA: Diagnosis not present

## 2022-06-25 DIAGNOSIS — Z0000 Encounter for general adult medical examination without abnormal findings: Secondary | ICD-10-CM

## 2022-06-25 DIAGNOSIS — M19072 Primary osteoarthritis, left ankle and foot: Secondary | ICD-10-CM | POA: Diagnosis not present

## 2022-06-25 LAB — HEMOGLOBIN A1C: Hgb A1c MFr Bld: 5.7 % (ref 4.6–6.5)

## 2022-06-25 LAB — HEPATIC FUNCTION PANEL
ALT: 25 U/L (ref 0–35)
AST: 24 U/L (ref 0–37)
Albumin: 4.4 g/dL (ref 3.5–5.2)
Alkaline Phosphatase: 83 U/L (ref 39–117)
Bilirubin, Direct: 0.2 mg/dL (ref 0.0–0.3)
Total Bilirubin: 0.9 mg/dL (ref 0.2–1.2)
Total Protein: 7.5 g/dL (ref 6.0–8.3)

## 2022-06-25 LAB — BASIC METABOLIC PANEL
BUN: 18 mg/dL (ref 6–23)
CO2: 28 mEq/L (ref 19–32)
Calcium: 9.5 mg/dL (ref 8.4–10.5)
Chloride: 98 mEq/L (ref 96–112)
Creatinine, Ser: 0.61 mg/dL (ref 0.40–1.20)
GFR: 91.04 mL/min (ref >60.00)
Glucose, Bld: 96 mg/dL (ref 70–99)
Potassium: 3.8 mEq/L (ref 3.5–5.1)
Sodium: 140 mEq/L (ref 135–145)

## 2022-06-25 LAB — LIPID PANEL
Cholesterol: 184 mg/dL (ref 0–200)
HDL: 69.4 mg/dL (ref >39.00)
LDL Cholesterol: 90 mg/dL (ref 0–99)
NonHDL: 114.72
Total CHOL/HDL Ratio: 3
Triglycerides: 122 mg/dL (ref 0.0–149.0)
VLDL: 24.4 mg/dL (ref 0.0–40.0)

## 2022-06-25 LAB — CBC WITH DIFFERENTIAL/PLATELET
Basophils Absolute: 0 10*3/uL (ref 0.0–0.1)
Basophils Relative: 0.4 % (ref 0.0–3.0)
Eosinophils Absolute: 0.2 10*3/uL (ref 0.0–0.7)
Eosinophils Relative: 2.3 % (ref 0.0–5.0)
HCT: 43 % (ref 36.0–46.0)
Hemoglobin: 14.3 g/dL (ref 12.0–15.0)
Lymphocytes Relative: 20.3 % (ref 12.0–46.0)
Lymphs Abs: 1.7 10*3/uL (ref 0.7–4.0)
MCHC: 33.1 g/dL (ref 30.0–36.0)
MCV: 87 fl (ref 78.0–100.0)
Monocytes Absolute: 0.5 10*3/uL (ref 0.1–1.0)
Monocytes Relative: 6.3 % (ref 3.0–12.0)
Neutro Abs: 5.9 10*3/uL (ref 1.4–7.7)
Neutrophils Relative %: 70.7 % (ref 43.0–77.0)
Platelets: 316 10*3/uL (ref 150.0–400.0)
RBC: 4.95 Mil/uL (ref 3.87–5.11)
RDW: 14.3 % (ref 11.5–15.5)
WBC: 8.4 10*3/uL (ref 4.0–10.5)

## 2022-06-25 LAB — TSH: TSH: 0.56 u[IU]/mL (ref 0.35–5.50)

## 2022-06-25 MED ORDER — HYDROCHLOROTHIAZIDE 12.5 MG PO TABS: 12.5000 mg | ORAL_TABLET | Freq: Every day | ORAL | 3 refills | Status: DC

## 2022-06-25 MED ORDER — LOSARTAN POTASSIUM 25 MG PO TABS
25.0000 mg | ORAL_TABLET | Freq: Every day | ORAL | 3 refills | Status: DC
Start: 2022-06-25 — End: 2023-06-11

## 2022-06-25 MED ORDER — ATORVASTATIN CALCIUM 20 MG PO TABS: 20.0000 mg | ORAL_TABLET | Freq: Every day | ORAL | 3 refills | Status: DC

## 2022-06-25 NOTE — Progress Notes (Signed)
Subjective:    Patient ID: Alicia Mcbride, female    DOB: [birth year], 71 y.o.   MRN: [id number]  HPI Here for a well exam. Her main concern today is left foot pain. This began about 2 months ago. No hx of trauma. The pain is localized to the lateral forefoot. It is sharp and it comes and goes. No swelling.    Review of Systems  Constitutional: Negative.   HENT: Negative.    Eyes: Negative.   Respiratory: Negative.    Cardiovascular: Negative.   Gastrointestinal: Negative.   Genitourinary:  Negative for decreased urine volume, difficulty urinating, dyspareunia, dysuria, enuresis, flank pain, frequency, hematuria, pelvic pain and urgency.  Musculoskeletal:  Positive for arthralgias.  Skin: Negative.   Neurological: Negative.  Negative for headaches.  Psychiatric/Behavioral: Negative.         Objective:   Physical Exam Constitutional:      General: She is not in acute distress.    Appearance: Normal appearance. She is well-developed.  HENT:     Head: Normocephalic and atraumatic.     Right Ear: External ear normal.     Left Ear: External ear normal.     Nose: Nose normal.     Mouth/Throat:     Pharynx: No oropharyngeal exudate.  Eyes:     General: No scleral icterus.    Conjunctiva/sclera: Conjunctivae normal.     Pupils: Pupils are equal, round, and reactive to light.  Neck:     Thyroid: No thyromegaly.     Vascular: No JVD.  Cardiovascular:     Rate and Rhythm: Normal rate and regular rhythm.     Heart sounds: Normal heart sounds. No murmur heard.    No friction rub. No gallop.  Pulmonary:     Effort: Pulmonary effort is normal. No respiratory distress.     Breath sounds: Normal breath sounds. No wheezing or rales.  Chest:     Chest wall: No tenderness.  Abdominal:     General: Bowel sounds are normal. There is no distension.     Palpations: Abdomen is soft. There is no mass.     Tenderness: There is no abdominal tenderness. There is no guarding or rebound.   Musculoskeletal:        General: No tenderness. Normal range of motion.     Cervical back: Normal range of motion and neck supple.     Comments: Left foot appears normal. She is tender in the forefoot between the 4th and 5th metatarsals   Lymphadenopathy:     Cervical: No cervical adenopathy.  Skin:    General: Skin is warm and dry.     Findings: No erythema or rash.  Neurological:     Mental Status: She is alert and oriented to person, place, and time.     Cranial Nerves: No cranial nerve deficit.     Motor: No abnormal muscle tone.     Coordination: Coordination normal.     Deep Tendon Reflexes: Reflexes are normal and symmetric. Reflexes normal.  Psychiatric:        Behavior: Behavior normal.        Thought Content: Thought content normal.        Judgment: Judgment normal.           Assessment & Plan:  Well exam. We discussed diet and exercise. Get fasting labs. Set up a colonoscopy. The foot pain is likely due to a Morton's neuroma. We will get Xrays today and will refer  her to Podiatry. Given a Prevnar 20 vaccine. She has never had a bone density test ,and she says she will ask her GYN about this during her visit next  month.  [name], MD

## 2022-06-25 NOTE — Addendum Note (Signed)
Addended by: Wyvonne Lenz on: 06/25/2022 10:47 AM   Modules accepted: Orders

## 2022-07-16 ENCOUNTER — Ambulatory Visit: Payer: PPO | Admitting: Podiatry

## 2022-07-18 ENCOUNTER — Encounter: Payer: Self-pay | Admitting: Gastroenterology

## 2022-07-29 ENCOUNTER — Ambulatory Visit (AMBULATORY_SURGERY_CENTER): Payer: PPO

## 2022-07-29 VITALS — Ht 67.0 in | Wt 204.0 lb

## 2022-07-29 DIAGNOSIS — Z86010 Personal history of colonic polyps: Secondary | ICD-10-CM

## 2022-07-29 DIAGNOSIS — Z1231 Encounter for screening mammogram for malignant neoplasm of breast: Secondary | ICD-10-CM | POA: Diagnosis not present

## 2022-07-29 MED ORDER — NA SULFATE-K SULFATE-MG SULF 17.5-3.13-1.6 GM/177ML PO SOLN: 1.0000 | ORAL | 0 refills | Status: DC

## 2022-07-29 MED ORDER — ONDANSETRON HCL 4 MG PO TABS: 4.0000 mg | ORAL_TABLET | ORAL | 0 refills | Status: DC

## 2022-07-29 NOTE — Progress Notes (Signed)
No egg or soy allergy known to patient  No issues known to pt with past sedation with any surgeries or procedures Patient denies ever being told they had issues or difficulty with intubation  No FH of Malignant Hyperthermia Pt is not on diet pills Pt is not on  home 02  Pt is not on blood thinners  Pt denies issues with constipation  No A fib or A flutter Have any cardiac testing pending--denied Pt instructed to use Single[employer]com or GoodRx for a price reduction on prep  denied

## 2022-08-13 DIAGNOSIS — N3941 Urge incontinence: Secondary | ICD-10-CM | POA: Diagnosis not present

## 2022-08-13 DIAGNOSIS — Z01419 Encounter for gynecological examination (general) (routine) without abnormal findings: Secondary | ICD-10-CM | POA: Diagnosis not present

## 2022-08-13 DIAGNOSIS — Z6831 Body mass index (BMI) 31.0-31.9, adult: Secondary | ICD-10-CM | POA: Diagnosis not present

## 2022-08-14 ENCOUNTER — Encounter: Payer: Self-pay | Admitting: Gastroenterology

## 2022-08-26 ENCOUNTER — Encounter: Payer: Self-pay | Admitting: Gastroenterology

## 2022-08-26 ENCOUNTER — Ambulatory Visit (AMBULATORY_SURGERY_CENTER): Payer: PPO | Admitting: Gastroenterology

## 2022-08-26 VITALS — BP 109/67 | HR 71 | Temp 97.1°F | Resp 16 | Ht 67.0 in | Wt 204.0 lb

## 2022-08-26 DIAGNOSIS — D127 Benign neoplasm of rectosigmoid junction: Secondary | ICD-10-CM | POA: Diagnosis not present

## 2022-08-26 DIAGNOSIS — I10 Essential (primary) hypertension: Secondary | ICD-10-CM | POA: Diagnosis not present

## 2022-08-26 DIAGNOSIS — D123 Benign neoplasm of transverse colon: Secondary | ICD-10-CM | POA: Diagnosis not present

## 2022-08-26 DIAGNOSIS — Z09 Encounter for follow-up examination after completed treatment for conditions other than malignant neoplasm: Secondary | ICD-10-CM | POA: Diagnosis not present

## 2022-08-26 DIAGNOSIS — D128 Benign neoplasm of rectum: Secondary | ICD-10-CM

## 2022-08-26 DIAGNOSIS — Z86010 Personal history of colonic polyps: Secondary | ICD-10-CM | POA: Diagnosis not present

## 2022-08-26 DIAGNOSIS — D125 Benign neoplasm of sigmoid colon: Secondary | ICD-10-CM

## 2022-08-26 HISTORY — PX: COLONOSCOPY: SHX174

## 2022-08-26 MED ORDER — SODIUM CHLORIDE 0.9 % IV SOLN: 500.0000 mL | Freq: Once | INTRAVENOUS | Status: DC

## 2022-08-26 NOTE — Patient Instructions (Signed)
Handout on polyps given to patient. Await pathology results. Resume previous diet and continue present medications.  Repeat colonoscopy for surveillance will be determined based off of pathology results.   YOU HAD AN ENDOSCOPIC PROCEDURE TODAY AT THE  ENDOSCOPY CENTER:   Refer to the procedure report that was given to you for any specific questions about what was found during the examination.  If the procedure report does not answer your questions, please call your gastroenterologist to clarify.  If you requested that your care partner not be given the details of your procedure findings, then the procedure report has been included in a sealed envelope for you to review at your convenience later.  YOU SHOULD EXPECT: Some feelings of bloating in the abdomen. Passage of more gas than usual.  Walking can help get rid of the air that was put into your GI tract during the procedure and reduce the bloating. If you had a lower endoscopy (such as a colonoscopy or flexible sigmoidoscopy) you may notice spotting of blood in your stool or on the toilet paper. If you underwent a bowel prep for your procedure, you may not have a normal bowel movement for a few days.  Please Note:  You might notice some irritation and congestion in your nose or some drainage.  This is from the oxygen used during your procedure.  There is no need for concern and it should clear up in a day or so.  SYMPTOMS TO REPORT IMMEDIATELY:  Following lower endoscopy (colonoscopy or flexible sigmoidoscopy):  Excessive amounts of blood in the stool  Significant tenderness or worsening of abdominal pains  Swelling of the abdomen that is new, acute  Fever of 100F or higher  For urgent or emergent issues, a gastroenterologist can be reached at any hour by calling [phone]. Do not use MyChart messaging for urgent concerns.    DIET:  We do recommend a small meal at first, but then you may proceed to your regular diet.  Drink  plenty of fluids but you should avoid alcoholic beverages for 24 hours.  ACTIVITY:  You should plan to take it easy for the rest of today and you should NOT DRIVE or use heavy machinery until tomorrow (because of the sedation medicines used during the test).    FOLLOW UP: Our staff will call the number listed on your records the next business day following your procedure.  We will call around 7:15- 8:00 am to check on you and address any questions or concerns that you may have regarding the information given to you following your procedure. If we do not reach you, we will leave a message.     If any biopsies were taken you will be contacted by phone or by letter within the next 1-3 weeks.  Please call us at [phone] if you have not heard about the biopsies in 3 weeks.    SIGNATURES/CONFIDENTIALITY: You and/or your care partner have signed paperwork which will be entered into your electronic medical record.  These signatures attest to the fact that that the information above on your After Visit Summary has been reviewed and is understood.  Full responsibility of the confidentiality of this discharge information lies with you and/or your care-partner. 

## 2022-08-26 NOTE — Op Note (Signed)
[facility] Patient Name: Alicia Mcbride Procedure Date: 08/26/2022 7:29 AM MRN: [id number] Endoscopist: Mallie Mussel L. Loletha Carrow , MD Age: 71 Referring MD:  Date of Birth: Mar 21, 1951 Gender: Female Account #: [id number] Procedure:                Colonoscopy Indications:              Surveillance: Personal history of adenomatous                            polyps on last colonoscopy 3 years ago                           TA x 6 (one 53m) and a 165mSSP - Aug 2020 Medicines:                Monitored Anesthesia Care Procedure:                Pre-Anesthesia Assessment:                           - Prior to the procedure, a History and Physical                            was performed, and patient medications and                            allergies were reviewed. The patient's tolerance of                            previous anesthesia was also reviewed. The risks                            and benefits of the procedure and the sedation                            options and risks were discussed with the patient.                            All questions were answered, and informed consent                            was obtained. Prior Anticoagulants: The patient has                            taken no previous anticoagulant or antiplatelet                            agents. ASA Grade Assessment: II - A patient with                            mild systemic disease. After reviewing the risks                            and benefits, the patient was deemed in  satisfactory condition to undergo the procedure.                           After obtaining informed consent, the colonoscope                            was passed under direct vision. Throughout the                            procedure, the patient's blood pressure, pulse, and                            oxygen saturations were monitored continuously. The                            CF [id number] #[id number] was  introduced through the anus                            and advanced to the the cecum, identified by                            appendiceal orifice and ileocecal valve. The                            colonoscopy was somewhat difficult due to a                            redundant colon. Successful completion of the                            procedure was aided by using manual pressure and                            straightening and shortening the scope to obtain                            bowel loop reduction. The patient tolerated the                            procedure well. The quality of the bowel                            preparation was excellent. The ileocecal valve,                            appendiceal orifice, and rectum were photographed.                            The bowel preparation used was SUPREP. Scope In: 8:07:16 AM Scope Out: 8:25:41 AM Scope Withdrawal Time: 0 hours 14 minutes 19 seconds  Total Procedure Duration: 0 hours 18 minutes 25 seconds  Findings:                 The perianal and digital rectal examinations were  normal.                           Three sessile polyps were found in the                            recto-sigmoid colon, sigmoid colon and transverse                            colon. The polyps were 4 to 8 mm in size. These                            polyps were removed with a cold snare. Resection                            and retrieval were complete.                           Repeat examination of right colon under NBI                            performed.                           The exam was otherwise without abnormality on                            direct and retroflexion views. Complications:            No immediate complications. Estimated Blood Loss:     Estimated blood loss was minimal. Impression:               - Three 4 to 8 mm polyps at the recto-sigmoid                            colon, in the sigmoid  colon and in the transverse                            colon, removed with a cold snare. Resected and                            retrieved.                           - The examination was otherwise normal on direct                            and retroflexion views. Recommendation:           - Patient has a contact number available for                            emergencies. The signs and symptoms of potential                            delayed complications were discussed with the  patient. Return to normal activities tomorrow.                            Written discharge instructions were provided to the                            patient.                           - Resume previous diet.                           - Continue present medications.                           - Await pathology results.                           - Repeat colonoscopy is recommended for                            surveillance. The colonoscopy date will be                            determined after pathology results from today's                            exam become available for review. Jlon Betker L. Loletha Carrow, MD 08/26/2022 8:32:59 AM This report has been signed electronically.

## 2022-08-26 NOTE — Progress Notes (Signed)
To pacu, VSS. Report to [email or web address].tb 

## 2022-08-26 NOTE — Progress Notes (Signed)
History and Physical:  This patient presents for endoscopic testing for: Encounter Diagnosis  Name Primary?   Personal history of colonic polyps Yes    TA and SSP x 7 (two were >69m) last colonoscopy Aug 2020 Patient denies chronic abdominal pain, rectal bleeding, constipation or diarrhea.   Patient is otherwise without complaints or active issues today.   Past Medical History: Past Medical History:  Diagnosis Date   Acute cholecystitis 01/04/2018   Allergy    Arthritis    Cataract    forming    Essential hypertension 07/23/2007   Qualifier: Diagnosis of  By: CRogue BussingCMA, [facility]    Gynecological examination    sees Dr. RAlden Hipp  Hyperlipemia, mixed 07/23/2007   Qualifier: Diagnosis of  By: CRogue BussingCMA, [name]     Hypertension    PONV (postoperative nausea and vomiting)      Past Surgical History: Past Surgical History:  Procedure Laterality Date   cataract surgery Bilateral    01/2022   CHOLECYSTECTOMY N/A 01/04/2018   Procedure: LAPAROSCOPIC CHOLECYSTECTOMY;  Surgeon: POlean Ree MD;  Location: [facility] ORS;  Service: General;  Laterality: N/A;   COLONOSCOPY  07/05/2019   per Dr. DLoletha Carrow adeomatous and serrated sessile polyps, repeat in 3 yrs    TONSILLECTOMY     TOTAL KNEE ARTHROPLASTY Bilateral 11/11/2017   Procedure: TOTAL KNEE BILATERAL;  Surgeon: AGaynelle Arabian MD;  Location: [facility] ORS;  Service: Orthopedics;  Laterality: Bilateral;   TUBAL LIGATION  1985    Allergies: No Known Allergies  Outpatient Meds: Current Outpatient Medications  Medication Sig Dispense Refill   atorvastatin (LIPITOR) 20 MG tablet Take 1 tablet (20 mg total) by mouth daily. 90 tablet 3   Doxylamine Succinate, Sleep, (SLEEP AID PO) Take by mouth.     hydrochlorothiazide (HYDRODIURIL) 12.5 MG tablet Take 1 tablet (12.5 mg total) by mouth daily. 90 tablet 3   losartan (COZAAR) 25 MG tablet Take 1 tablet (25 mg total) by mouth daily. 90 tablet 3   Magnesium 400 MG CAPS  Take by mouth.     cholecalciferol (VITAMIN D3) 25 MCG (1000 UT) tablet Take 2,000 Units by mouth daily.     ibuprofen (ADVIL) 600 MG tablet      ondansetron (ZOFRAN) 4 MG tablet Take 1 tablet (4 mg total) by mouth as directed. Take 1 tab 30-60 minutes prior to each prep dose 2 tablet 0   oxybutynin (DITROPAN-XL) 10 MG 24 hr tablet Take 10 mg by mouth every morning. (Patient not taking: Reported on 08/26/2022)     Current Facility-Administered Medications  Medication Dose Route Frequency Provider Last Rate Last Admin   0.9 %  sodium chloride infusion  500 mL Intravenous Once Danis, HEstill CottaIII, MD          ___________________________________________________________________ Objective   Exam:  BP 139/75   Pulse 80   Temp (!) 97.1 F (36.2 C) (Temporal)   Ht '5\' 7"'$  (1.702 m)   Wt 204 lb (92.5 kg)   SpO2 (!) 9%   BMI 31.95 kg/m   CV: regular , S1/S2 Resp: clear to auscultation bilaterally, normal RR and effort noted GI: soft, no tenderness, with active bowel sounds.   Assessment: Encounter Diagnosis  Name Primary?   Personal history of colonic polyps Yes     Plan: Colonoscopy  The benefits and risks of the planned procedure were described in detail with the patient or (when appropriate) their health care proxy.  Risks were outlined as including,  but not limited to, bleeding, infection, perforation, adverse medication reaction leading to cardiac or pulmonary decompensation, pancreatitis (if ERCP).  The limitation of incomplete mucosal visualization was also discussed.  No guarantees or warranties were given.    The patient is appropriate for an endoscopic procedure in the ambulatory setting.   - Wilfrid Lund, MD

## 2022-08-26 NOTE — Progress Notes (Signed)
Called to room to assist during endoscopic procedure.  Patient ID and intended procedure confirmed with present staff. Received instructions for my participation in the procedure from the performing physician.  

## 2022-08-27 ENCOUNTER — Telehealth: Payer: Self-pay | Admitting: *Deleted

## 2022-08-27 NOTE — Telephone Encounter (Signed)
  Follow up Call-     08/26/2022    7:14 AM  Call back number  Post procedure Call Back phone  # [phone]  Permission to leave phone message Yes     Patient questions:  Do you have a fever, pain , or abdominal swelling? No. Pain Score  0 *  Have you tolerated food without any problems? Yes.    Have you been able to return to your normal activities? Yes.    Do you have any questions about your discharge instructions: Diet   No. Medications  No. Follow up visit  No.  Do you have questions or concerns about your Care? No.  Actions: * If pain score is 4 or above: No action needed, pain <4.

## 2022-09-04 ENCOUNTER — Encounter: Payer: Self-pay | Admitting: Gastroenterology

## 2022-12-21 DIAGNOSIS — J069 Acute upper respiratory infection, unspecified: Secondary | ICD-10-CM | POA: Diagnosis not present

## 2022-12-21 DIAGNOSIS — R059 Cough, unspecified: Secondary | ICD-10-CM | POA: Diagnosis not present

## 2023-02-13 DIAGNOSIS — J029 Acute pharyngitis, unspecified: Secondary | ICD-10-CM | POA: Diagnosis not present

## 2023-02-13 DIAGNOSIS — J0100 Acute maxillary sinusitis, unspecified: Secondary | ICD-10-CM | POA: Diagnosis not present

## 2023-02-17 ENCOUNTER — Encounter: Payer: Self-pay | Admitting: Nurse Practitioner

## 2023-02-17 ENCOUNTER — Ambulatory Visit (INDEPENDENT_AMBULATORY_CARE_PROVIDER_SITE_OTHER): Payer: HMO | Admitting: Nurse Practitioner

## 2023-02-17 VITALS — BP 120/64 | HR 93 | Temp 98.7°F | Ht 67.0 in | Wt 205.2 lb

## 2023-02-17 DIAGNOSIS — J40 Bronchitis, not specified as acute or chronic: Secondary | ICD-10-CM | POA: Insufficient documentation

## 2023-02-17 MED ORDER — BENZONATATE 200 MG PO CAPS
200.0000 mg | ORAL_CAPSULE | Freq: Three times a day (TID) | ORAL | 0 refills | Status: DC | PRN
Start: 2023-02-17 — End: 2024-01-04

## 2023-02-17 MED ORDER — ALBUTEROL SULFATE HFA 108 (90 BASE) MCG/ACT IN AERS
2.0000 | INHALATION_SPRAY | Freq: Four times a day (QID) | RESPIRATORY_TRACT | 0 refills | Status: DC | PRN
Start: 2023-02-17 — End: 2024-01-04

## 2023-02-17 MED ORDER — METHYLPREDNISOLONE 4 MG PO TBPK
ORAL_TABLET | ORAL | 0 refills | Status: DC
Start: 2023-02-17 — End: 2024-01-04

## 2023-02-17 NOTE — Progress Notes (Signed)
Bethanie Dicker, NP-C Phone: [phone]  Alicia Mcbride is a 72 y.o. female who presents today for cough and congestion. Patient was seen at Urgent Care 4 days ago and diagnosed with sinusitis. She was treated with Augmentin and Bromfed DM. She continues to have a cough and fatigue. She reports she has been gradually improving. She tested negative for COVID and the Flu. Her symptoms began almost 3 weeks ago.  Respiratory illness:  Cough- Yes, nonproductive, deep  Congestion-   Sinus- Yes   Chest- No  Post nasal drip- Yes  Sore throat- Yes  Shortness of breath- Yes with excessive coughing  Fever- No  Fatigue/Myalgia- Yes Headache- Yes Nausea/Vomiting- No Taste disturbance- No  Smell disturbance- Yes  Covid exposure- No  Covid vaccination- x 2  Flu vaccination- Declined  Medications- Ibuprofen, Mucinex, Bromfed DM and Augmentin   Social History   Tobacco Use  Smoking Status Never  Smokeless Tobacco Never    Current Outpatient Medications on File Prior to Visit  Medication Sig Dispense Refill   amoxicillin-clavulanate (AUGMENTIN) 875-125 MG tablet Take by mouth.     atorvastatin (LIPITOR) 20 MG tablet Take 1 tablet (20 mg total) by mouth daily. 90 tablet 3   brompheniramine-pseudoephedrine-DM 30-2-10 MG/5ML syrup Take by mouth.     cholecalciferol (VITAMIN D3) 25 MCG (1000 UT) tablet Take 2,000 Units by mouth daily.     Doxylamine Succinate, Sleep, (SLEEP AID PO) Take by mouth.     hydrochlorothiazide (HYDRODIURIL) 12.5 MG tablet Take 1 tablet (12.5 mg total) by mouth daily. 90 tablet 3   ibuprofen (ADVIL) 600 MG tablet      losartan (COZAAR) 25 MG tablet Take 1 tablet (25 mg total) by mouth daily. 90 tablet 3   MYRBETRIQ 50 MG TB24 tablet      No current facility-administered medications on file prior to visit.    ROS see history of present illness  Objective  Physical Exam Vitals:   02/17/23 0846  BP: 120/64  Pulse: 93  Temp: 98.7 F (37.1 C)  SpO2: 95%     BP Readings from Last 3 Encounters:  02/17/23 120/64  08/26/22 109/67  06/25/22 110/76   Wt Readings from Last 3 Encounters:  02/17/23 205 lb 3.2 oz (93.1 kg)  08/26/22 204 lb (92.5 kg)  07/29/22 204 lb (92.5 kg)    Physical Exam Constitutional:      General: She is not in acute distress.    Appearance: Normal appearance.  HENT:     Head: Normocephalic.     Right Ear: Tympanic membrane normal.     Left Ear: Tympanic membrane normal.     Nose: Congestion present.     Mouth/Throat:     Mouth: Mucous membranes are moist.     Pharynx: Oropharynx is clear.  Eyes:     Conjunctiva/sclera: Conjunctivae normal.     Pupils: Pupils are equal, round, and reactive to light.  Cardiovascular:     Rate and Rhythm: Normal rate and regular rhythm.     Heart sounds: Normal heart sounds.  Pulmonary:     Effort: Pulmonary effort is normal.     Breath sounds: Normal breath sounds.  Abdominal:     General: Abdomen is flat. Bowel sounds are normal.     Palpations: Abdomen is soft. There is no mass.     Tenderness: There is no abdominal tenderness.  Lymphadenopathy:     Cervical: No cervical adenopathy.  Skin:    General: Skin is warm and  dry.  Neurological:     General: No focal deficit present.     Mental Status: She is alert.  Psychiatric:        Mood and Affect: Mood normal.        Behavior: Behavior normal.    Assessment/Plan: Please see individual problem list.  Bronchitis Assessment & Plan: Symptoms and duration consistent with bronchitis. Encouraged patient to continue Augmentin and complete entire course as prescribed. Will add MDP, Albuterol inhaler PRN and Benzonatate for cough. Advised adequate hydration. Return precautions given to patient.   Orders: -     methylPREDNISolone; Take as directed.  Dispense: 21 each; Refill: 0 -     Benzonatate; Take 1 capsule (200 mg total) by mouth 3 (three) times daily as needed for cough.  Dispense: 30 capsule; Refill: 0 -      Albuterol Sulfate HFA; Inhale 2 puffs into the lungs every 6 (six) hours as needed for wheezing or shortness of breath.  Dispense: 8 g; Refill: 0   Return if symptoms worsen or fail to improve.   Bethanie Dicker, NP-C [facility] - [employer]

## 2023-02-17 NOTE — Assessment & Plan Note (Signed)
Symptoms and duration consistent with bronchitis. Encouraged patient to continue Augmentin and complete entire course as prescribed. Will add MDP, Albuterol inhaler PRN and Benzonatate for cough. Advised adequate hydration. Return precautions given to patient.

## 2023-02-19 ENCOUNTER — Telehealth: Payer: Self-pay | Admitting: Family Medicine

## 2023-02-19 NOTE — Telephone Encounter (Signed)
Contacted Alicia Mcbride to schedule their annual wellness visit. Call back at later date: 04/2023  [name] AWV direct phone # [phone]  Spoke to patient to schedule AWv.  She stated she didn't understand why she needed this.  She is healthy.  She has allergies right now and would like a call back in June

## 2023-04-02 ENCOUNTER — Other Ambulatory Visit: Payer: Self-pay | Admitting: Family Medicine

## 2023-05-13 DIAGNOSIS — H26493 Other secondary cataract, bilateral: Secondary | ICD-10-CM | POA: Diagnosis not present

## 2023-05-13 DIAGNOSIS — H43813 Vitreous degeneration, bilateral: Secondary | ICD-10-CM | POA: Diagnosis not present

## 2023-05-13 DIAGNOSIS — H02834 Dermatochalasis of left upper eyelid: Secondary | ICD-10-CM | POA: Diagnosis not present

## 2023-05-13 DIAGNOSIS — H02831 Dermatochalasis of right upper eyelid: Secondary | ICD-10-CM | POA: Diagnosis not present

## 2023-06-11 ENCOUNTER — Other Ambulatory Visit: Payer: Self-pay | Admitting: Family Medicine

## 2023-08-03 DIAGNOSIS — Z1231 Encounter for screening mammogram for malignant neoplasm of breast: Secondary | ICD-10-CM | POA: Diagnosis not present

## 2023-08-03 LAB — HM MAMMOGRAPHY

## 2023-08-05 ENCOUNTER — Other Ambulatory Visit: Payer: Self-pay | Admitting: Obstetrics & Gynecology

## 2023-08-05 DIAGNOSIS — R928 Other abnormal and inconclusive findings on diagnostic imaging of breast: Secondary | ICD-10-CM

## 2023-08-07 ENCOUNTER — Ambulatory Visit
Admission: RE | Admit: 2023-08-07 | Discharge: 2023-08-07 | Disposition: A | Discharge status: Home / Self Care | Payer: HMO | Source: Ambulatory Visit | Attending: Obstetrics & Gynecology | Admitting: Obstetrics & Gynecology

## 2023-08-07 ENCOUNTER — Ambulatory Visit: Payer: HMO

## 2023-08-07 ENCOUNTER — Ambulatory Visit
Admission: RE | Admit: 2023-08-07 | Discharge: 2023-08-07 | Disposition: A | Discharge status: Home / Self Care | Payer: HMO | Source: Ambulatory Visit

## 2023-08-07 DIAGNOSIS — R928 Other abnormal and inconclusive findings on diagnostic imaging of breast: Secondary | ICD-10-CM

## 2023-08-07 DIAGNOSIS — N6321 Unspecified lump in the left breast, upper outer quadrant: Secondary | ICD-10-CM | POA: Diagnosis not present

## 2023-11-26 DIAGNOSIS — J069 Acute upper respiratory infection, unspecified: Secondary | ICD-10-CM | POA: Diagnosis not present

## 2023-11-26 DIAGNOSIS — Z20822 Contact with and (suspected) exposure to covid-19: Secondary | ICD-10-CM | POA: Diagnosis not present

## 2023-11-26 DIAGNOSIS — R059 Cough, unspecified: Secondary | ICD-10-CM | POA: Diagnosis not present

## 2023-12-28 ENCOUNTER — Other Ambulatory Visit: Payer: Self-pay | Admitting: Family Medicine

## 2023-12-29 ENCOUNTER — Telehealth: Payer: Self-pay

## 2023-12-29 NOTE — Telephone Encounter (Signed)
 Copied from CRM [id number]. Topic: General - Other >> Dec 29, 2023  9:38 AM Larwance Sachs wrote: Reason for CRM: Patient would like a call back, unsure on why

## 2023-12-31 NOTE — Telephone Encounter (Signed)
 Spoke with pt states that she got refills for her meds from [facility] and will last her through to her visit with Dr Clent Ridges on 01/04/24

## 2024-01-04 ENCOUNTER — Encounter: Payer: Self-pay | Admitting: Family Medicine

## 2024-01-04 ENCOUNTER — Ambulatory Visit (INDEPENDENT_AMBULATORY_CARE_PROVIDER_SITE_OTHER): Payer: HMO | Admitting: Family Medicine

## 2024-01-04 VITALS — BP 110/64 | HR 74 | Temp 98.2°F | Ht 67.0 in | Wt 207.0 lb

## 2024-01-04 DIAGNOSIS — R7989 Other specified abnormal findings of blood chemistry: Secondary | ICD-10-CM

## 2024-01-04 DIAGNOSIS — Z0000 Encounter for general adult medical examination without abnormal findings: Secondary | ICD-10-CM

## 2024-01-04 LAB — CBC WITH DIFFERENTIAL/PLATELET
Basophils Absolute: 0 10*3/uL (ref 0.0–0.1)
Basophils Relative: 0.5 % (ref 0.0–3.0)
Eosinophils Absolute: 0.1 10*3/uL (ref 0.0–0.7)
Eosinophils Relative: 1.2 % (ref 0.0–5.0)
HCT: 45.5 % (ref 36.0–46.0)
Hemoglobin: 15.3 g/dL — ABNORMAL HIGH (ref 12.0–15.0)
Lymphocytes Relative: 21.5 % (ref 12.0–46.0)
Lymphs Abs: 1.9 10*3/uL (ref 0.7–4.0)
MCHC: 33.6 g/dL (ref 30.0–36.0)
MCV: 85.7 fl (ref 78.0–100.0)
Monocytes Absolute: 0.6 10*3/uL (ref 0.1–1.0)
Monocytes Relative: 6.3 % (ref 3.0–12.0)
Neutro Abs: 6.4 10*3/uL (ref 1.4–7.7)
Neutrophils Relative %: 70.5 % (ref 43.0–77.0)
Platelets: 336 10*3/uL (ref 150.0–400.0)
RBC: 5.31 Mil/uL — ABNORMAL HIGH (ref 3.87–5.11)
RDW: 13.4 % (ref 11.5–15.5)
WBC: 9.1 10*3/uL (ref 4.0–10.5)

## 2024-01-04 LAB — TSH: TSH: 0.25 u[IU]/mL — ABNORMAL LOW (ref 0.35–5.50)

## 2024-01-04 LAB — HEMOGLOBIN A1C: Hgb A1c MFr Bld: 5.8 % (ref 4.6–6.5)

## 2024-01-04 MED ORDER — ATORVASTATIN CALCIUM 20 MG PO TABS: 20.0000 mg | ORAL_TABLET | Freq: Every day | ORAL | 3 refills | Status: AC

## 2024-01-04 MED ORDER — LOSARTAN POTASSIUM 25 MG PO TABS: 25.0000 mg | ORAL_TABLET | Freq: Every day | ORAL | 3 refills | Status: AC

## 2024-01-04 MED ORDER — HYDROCHLOROTHIAZIDE 12.5 MG PO TABS: 12.5000 mg | ORAL_TABLET | Freq: Every day | ORAL | 3 refills | Status: AC

## 2024-01-04 MED ORDER — MYRBETRIQ 50 MG PO TB24: 50.0000 mg | ORAL_TABLET | Freq: Every day | ORAL | 11 refills | Status: AC

## 2024-01-04 NOTE — Progress Notes (Signed)
   Subjective:    Patient ID: Alicia Mcbride, female    DOB: [birth year], 73 y.o.   MRN: [id number]  HPI Here for a well exam. She feels well. She will see her GYN tomorrow.    Review of Systems  Constitutional: Negative.   HENT: Negative.    Eyes: Negative.   Respiratory: Negative.    Cardiovascular: Negative.   Gastrointestinal: Negative.   Genitourinary:  Negative for decreased urine volume, difficulty urinating, dyspareunia, dysuria, enuresis, flank pain, frequency, hematuria, pelvic pain and urgency.  Musculoskeletal: Negative.   Skin: Negative.   Neurological: Negative.  Negative for headaches.  Psychiatric/Behavioral: Negative.         Objective:   Physical Exam Constitutional:      General: She is not in acute distress.    Appearance: Normal appearance. She is well-developed.  HENT:     Head: Normocephalic and atraumatic.     Right Ear: External ear normal.     Left Ear: External ear normal.     Nose: Nose normal.     Mouth/Throat:     Pharynx: No oropharyngeal exudate.  Eyes:     General: No scleral icterus.    Conjunctiva/sclera: Conjunctivae normal.     Pupils: Pupils are equal, round, and reactive to light.  Neck:     Thyroid: No thyromegaly.     Vascular: No JVD.  Cardiovascular:     Rate and Rhythm: Normal rate and regular rhythm.     Pulses: Normal pulses.     Heart sounds: Normal heart sounds. No murmur heard.    No friction rub. No gallop.  Pulmonary:     Effort: Pulmonary effort is normal. No respiratory distress.     Breath sounds: Normal breath sounds. No wheezing or rales.  Chest:     Chest wall: No tenderness.  Abdominal:     General: Bowel sounds are normal. There is no distension.     Palpations: Abdomen is soft. There is no mass.     Tenderness: There is no abdominal tenderness. There is no guarding or rebound.  Musculoskeletal:        General: No tenderness. Normal range of motion.     Cervical back: Normal range of motion and neck  supple.  Lymphadenopathy:     Cervical: No cervical adenopathy.  Skin:    General: Skin is warm and dry.     Findings: No erythema or rash.  Neurological:     General: No focal deficit present.     Mental Status: She is alert and oriented to person, place, and time.     Cranial Nerves: No cranial nerve deficit.     Motor: No abnormal muscle tone.     Coordination: Coordination normal.     Deep Tendon Reflexes: Reflexes are normal and symmetric. Reflexes normal.  Psychiatric:        Mood and Affect: Mood normal.        Behavior: Behavior normal.        Thought Content: Thought content normal.        Judgment: Judgment normal.           Assessment & Plan:  Well exam. We discussed diet and exercise. Get fasting labs. Gershon Crane, MD

## 2024-01-05 LAB — LIPID PANEL
Cholesterol: 162 mg/dL (ref 0–200)
HDL: 64.4 mg/dL (ref >39.00)
LDL Cholesterol: 79 mg/dL (ref 0–99)
NonHDL: 97.12
Total CHOL/HDL Ratio: 3
Triglycerides: 91 mg/dL (ref 0.0–149.0)
VLDL: 18.2 mg/dL (ref 0.0–40.0)

## 2024-01-05 LAB — BASIC METABOLIC PANEL
BUN: 18 mg/dL (ref 6–23)
CO2: 27 meq/L (ref 19–32)
Calcium: 9.6 mg/dL (ref 8.4–10.5)
Chloride: 99 meq/L (ref 96–112)
Creatinine, Ser: 0.64 mg/dL (ref 0.40–1.20)
GFR: 89.03 mL/min (ref >60.00)
Glucose, Bld: 97 mg/dL (ref 70–99)
Potassium: 4 meq/L (ref 3.5–5.1)
Sodium: 141 meq/L (ref 135–145)

## 2024-01-05 LAB — HEPATIC FUNCTION PANEL
ALT: 17 U/L (ref 0–35)
AST: 16 U/L (ref 0–37)
Albumin: 4.8 g/dL (ref 3.5–5.2)
Alkaline Phosphatase: 81 U/L (ref 39–117)
Bilirubin, Direct: 0.2 mg/dL (ref 0.0–0.3)
Total Bilirubin: 0.8 mg/dL (ref 0.2–1.2)
Total Protein: 7.9 g/dL (ref 6.0–8.3)

## 2024-01-13 ENCOUNTER — Other Ambulatory Visit (INDEPENDENT_AMBULATORY_CARE_PROVIDER_SITE_OTHER)

## 2024-01-13 ENCOUNTER — Other Ambulatory Visit: Payer: HMO

## 2024-01-13 DIAGNOSIS — R7989 Other specified abnormal findings of blood chemistry: Secondary | ICD-10-CM | POA: Diagnosis not present

## 2024-01-13 LAB — TSH: TSH: 0.37 u[IU]/mL (ref 0.35–5.50)

## 2024-01-13 LAB — T3, FREE: T3, Free: 4.2 pg/mL (ref 2.3–4.2)

## 2024-01-13 LAB — T4, FREE: Free T4: 0.91 ng/dL (ref 0.60–1.60)

## 2024-01-18 ENCOUNTER — Encounter: Payer: Self-pay | Admitting: *Deleted

## 2024-03-17 ENCOUNTER — Encounter: Payer: Self-pay | Admitting: Family Medicine

## 2024-03-28 NOTE — Telephone Encounter (Signed)
 She is not due until October 2026

## 2024-05-18 DIAGNOSIS — D23112 Other benign neoplasm of skin of right lower eyelid, including canthus: Secondary | ICD-10-CM | POA: Diagnosis not present

## 2024-05-18 DIAGNOSIS — H26493 Other secondary cataract, bilateral: Secondary | ICD-10-CM | POA: Diagnosis not present

## 2024-05-18 DIAGNOSIS — H04123 Dry eye syndrome of bilateral lacrimal glands: Secondary | ICD-10-CM | POA: Diagnosis not present

## 2024-05-31 DIAGNOSIS — H26492 Other secondary cataract, left eye: Secondary | ICD-10-CM | POA: Diagnosis not present

## 2024-06-07 DIAGNOSIS — H26491 Other secondary cataract, right eye: Secondary | ICD-10-CM | POA: Diagnosis not present

## 2024-07-29 DIAGNOSIS — M791 Myalgia, unspecified site: Secondary | ICD-10-CM | POA: Diagnosis not present

## 2024-07-29 DIAGNOSIS — Z20822 Contact with and (suspected) exposure to covid-19: Secondary | ICD-10-CM | POA: Diagnosis not present

## 2024-07-29 DIAGNOSIS — U071 COVID-19: Secondary | ICD-10-CM | POA: Diagnosis not present

## 2024-08-18 DIAGNOSIS — Z01419 Encounter for gynecological examination (general) (routine) without abnormal findings: Secondary | ICD-10-CM | POA: Diagnosis not present

## 2024-08-18 DIAGNOSIS — Z124 Encounter for screening for malignant neoplasm of cervix: Secondary | ICD-10-CM | POA: Diagnosis not present

## 2024-08-18 DIAGNOSIS — Z1231 Encounter for screening mammogram for malignant neoplasm of breast: Secondary | ICD-10-CM | POA: Diagnosis not present

## 2024-08-18 LAB — HM MAMMOGRAPHY

## 2024-08-22 ENCOUNTER — Encounter: Payer: Self-pay | Admitting: Obstetrics & Gynecology

## 2024-08-23 LAB — HM PAP SMEAR

## 2024-08-24 ENCOUNTER — Encounter: Payer: Self-pay | Admitting: Obstetrics & Gynecology

## 2024-10-11 ENCOUNTER — Telehealth: Payer: Self-pay | Admitting: *Deleted

## 2024-10-11 DIAGNOSIS — Z20828 Contact with and (suspected) exposure to other viral communicable diseases: Secondary | ICD-10-CM | POA: Diagnosis not present

## 2024-10-11 NOTE — Telephone Encounter (Signed)
 Copied from CRM [id number]. Topic: Clinical - Medical Advice >> Oct 11, 2024 12:43 PM Shereese L wrote: Reason for CRM: Patient called in to follow up with previous message left to get prescribed Tamiflu. Spoke  with CAL and was adv that the patient would have to schedule a virtual appt to get the script sent over. Patient was very upset and stated that she will be writing a letter and leaving the practice

## 2024-10-11 NOTE — Telephone Encounter (Signed)
 Noted

## 2024-10-13 ENCOUNTER — Telehealth: Payer: Self-pay

## 2024-10-13 NOTE — Telephone Encounter (Signed)
 Copied from CRM [id number]. Topic: Clinical - Medical Advice >> Oct 10, 2024 12:21 PM Alicia Mcbride wrote: Reason for CRM: Patient was around her granddaughter recently and she just found out that the granddaughter has the flu. Patient has a trip coming up and does not want to get sick, currently asymptomatic, but is requesting a prescription of Tamiflu. Please call patient if this is doable. Patient uses [facility] on [address].

## 2024-10-14 MED ORDER — OSELTAMIVIR PHOSPHATE 75 MG PO CAPS: 75.0000 mg | ORAL_CAPSULE | Freq: Two times a day (BID) | ORAL | 0 refills | Status: AC

## 2024-10-14 NOTE — Telephone Encounter (Signed)
I sent in the RX  

## 2024-10-14 NOTE — Addendum Note (Signed)
 Addended by: JOHNNY SENIOR A on: 10/14/2024 11:12 AM   Modules accepted: Orders

## 2024-11-08 ENCOUNTER — Ambulatory Visit (INDEPENDENT_AMBULATORY_CARE_PROVIDER_SITE_OTHER): Admitting: Family Medicine

## 2024-11-08 ENCOUNTER — Encounter: Payer: Self-pay | Admitting: Family Medicine

## 2024-11-08 DIAGNOSIS — E2839 Other primary ovarian failure: Secondary | ICD-10-CM

## 2024-11-08 NOTE — Patient Instructions (Addendum)
 I really enjoyed getting to talk with you today! I am available on Tuesdays and Thursdays for virtual visits if you have any questions or concerns, or if I can be of any further assistance.   CHECKLIST FROM ANNUAL WELLNESS VISIT:  -Follow up (please call to schedule if not scheduled after visit):   -yearly for annual wellness visit with primary care office  Here is a list of your preventive care/health maintenance measures and the plan for each if any are due:  PLAN For any measures below that may be due:    1. We sent an order for the bone density test. Please call to schedule at the number provided. If you have any issues scheduling please call our office. Please follow up with Dr. Johnny regarding the results once the test is complete.    2. Can get vaccines at the pharmacy. If you do, please let us  know so that we can update your record.   Health Maintenance  Topic Date Due   Bone Density Scan  Never done   DTaP/Tdap/Td (1 - Tdap) 11/09/2024 (Originally 10/15/1970)   Medicare Annual Wellness (AWV)  11/09/2024 (Originally 04/22/2023)   Zoster Vaccines- Shingrix (1 of 2) 11/09/2024 (Originally 10/14/2002)   COVID-19 Vaccine (3 - 2025-26 season) 11/24/2024 (Originally 07/11/2024)   Influenza Vaccine  02/07/2025 (Originally 06/10/2024)   Hepatitis C Screening  11/08/2025 (Originally 10/14/1969)   Colonoscopy  08/26/2025   Mammogram  08/18/2026   Pneumococcal Vaccine: 50+ Years  Completed   Meningococcal B Vaccine  Aged Out    -See a dentist at least yearly  -Get your eyes checked and then per your eye specialist's recommendations  -Other issues addressed today:   -I have included below further information regarding a healthy whole foods based diet, physical activity guidelines for adults, stress management and opportunities for social connections. I hope you find this information useful.    -----------------------------------------------------------------------------------------------------------------------------------------------------------------------------------------------------------------------------------------------------------    NUTRITION: -eat real food: lots of colorful vegetables (half the plate) and fruits -5-7 servings of vegetables and fruits per day (fresh or steamed is best), exp. 2 servings of vegetables with lunch and dinner and 2 servings of fruit per day. Berries and greens such as kale and collards are great choices.  -consume on a regular basis:  fresh fruits, fresh veggies, fish, nuts, seeds, healthy oils (such as olive oil, avocado oil), whole grains (make sure for bread/pasta/crackers/etc., that the first ingredient on label contains the word whole), legumes. -can eat small amounts of dairy and lean meat (no larger than the palm of your hand), but avoid processed meats such as ham, bacon, lunch meat, etc. -drink water -try to avoid fast food and pre-packaged foods, processed meat, ultra processed foods/beverages (donuts, candy, etc.) -most experts advise limiting sodium to < 2300mg  per day, should limit further is any chronic conditions such as high blood pressure, heart disease, diabetes, etc. The American Heart Association advised that < 1500mg  is is ideal -try to avoid foods/beverages that contain any ingredients with names you do not recognize  -try to avoid foods/beverages  with added sugar or sweeteners/sweets  -try to avoid sweet drinks (including diet drinks): soda, juice, Gatorade, sweet tea, power drinks, diet drinks -try to avoid white rice, white bread, pasta (unless whole grain)  EXERCISE GUIDELINES FOR ADULTS: -if you wish to increase your physical activity, do so gradually and with the approval of your doctor -STOP and seek medical care immediately if you have any chest pain, chest discomfort or trouble breathing  when starting or  increasing exercise  -move and stretch your body, legs, feet and arms when sitting for long periods -Physical activity guidelines for optimal health in adults: -get at least 150 minutes per week of moderate exercise (can talk, but not sing); this is about 20-30 minutes of sustained activity 5-7 days per week or two 10-15 minute episodes of sustained activity 5-7 days per week -do some muscle building/resistance training/strength training at least 2 days per week  -balance exercises 3+ days per week:   Stand somewhere where you have something sturdy to hold onto if you lose balance    1) lift up on toes, then back down, start with 5x per day and work up to 20x   2) stand and lift one leg straight out to the side so that foot is a few inches of the floor, start with 5x each side and work up to 20x each side   3) stand on one foot, start with 5 seconds each side and work up to 20 seconds on each side  If you need ideas or help with getting more active:  -Silver sneakers https://tools.silversneakers.com  -Walk with a Doc: Http://www.duncan-williams.com/  -try to include resistance (weight lifting/strength building) and balance exercises twice per week: or the following link for ideas: http://castillo-powell.com/  buyducts.dk  STRESS MANAGEMENT: -can try meditating, or just sitting quietly with deep breathing while intentionally relaxing all parts of your body for 5 minutes daily -if you need further help with stress, anxiety or depression please follow up with your primary doctor or contact the wonderful folks at [facility]: [phone]  SOCIAL CONNECTIONS: -options in [place] if you wish to engage in more social and exercise related activities:  -Silver sneakers https://tools.silversneakers.com  -Walk with a Doc: Http://www.duncan-williams.com/  -Check out the [facility] Active Adults 50+  section on the [place] [employer] companies (hiking clubs, book clubs, cards and games, chess, exercise classes, aquatic classes and much more) - see the website for details: https://www.[place].gov/departments/parks-recreation/active-adults50  -YouTube has lots of exercise videos for different ages and abilities as well  -[facility] (a variety of indoor and outdoor inperson activities for adults). 5390386414. [address]  -Virtual Online Classes (a variety of topics): see seniorplanet.org or call [phone]  -consider volunteering at a school, hospice center, church, senior center or elsewhere   FOR IMPROVED SLEEP AND TO RESET YOUR SLEEP SCHEDULE:  []  Schedule sleep counseling(cognitive behavioral therapy).   [facility] is a good option.   Call for appointment: [phone]  []  Exercise 30 minutes daily. Some people do better with exercise in the morning, other do better exercising later in the day.  []  Avoid caffeine and alcohol - particularly in the evenings. For some people even a little alcohol can impair sleep.   []  Go to bed and wake up at the same time everyday (within a 30 minute window). When you get up in the morning for your designated wake time - turn on lights and open curtains right away. This will help to set your internal sleep clock.   []  Keep bedroom cool, dark and quiet - if you have to get up at night make sure there is no white light from street lamps, night lights, lights, clock, devices etc. that enters your eyes. Instead, use red light flashlight or night lights and avoid looking directly at the light while up.   []  Set 1-2 hour bedtime routine, dim lights, avoid screens (phones, computers, TVs, etc during  this time), consider sleepytime tea, warm bath or shower, avoid alcohol and caffeine  []  Reserve bed for sleep - do not read, watch TV, look at phone or device, etc., in bed.  []  If you toss and turn for more  then 10-15 minutes, get out of bed and list or journal thoughts or do quiet activity (not screen-time, no tv, phone, computer) then go back to bed. Repeat as needed. Try not to worry about when you will eventually fall asleep.  []  Some people find that a half dose of benadryl , melatonin, tylenol  pm or unisom on a few nights per week is helpful initially for a few weeks.  []  Seek help for any depression or anxiety.  [] Prescription strength sleep medications should only be used in severe cases of insomnia if other measures fail and should be used sparingly.  I hope you are feeling better soon! Follow up with your doctor in 3-4 weeks or sooner if your symptoms worsen or new concerns arise.

## 2024-11-08 NOTE — Progress Notes (Signed)
 " --------------------------------------------------------------------------------------------------------------------------------------------------------------------  Because this visit was a virtual/telehealth visit, some criteria may be missing or patient reported. Any vitals not documented were not able to be obtained and vitals that have been documented are patient reported.    MEDICARE ANNUAL PREVENTIVE VISIT WITH PROVIDER: (Welcome to Medicare, initial annual wellness or annual wellness exam)  Virtual Visit via Phone Note  I connected with Alicia Mcbride on 11/08/2024 by phone enabled telemedicine application and verified that I am speaking with the correct person using two identifiers.  Location patient: home Location provider:work or home office Persons participating in the virtual visit: patient, provider  Concerns and/or follow up today: detailed intake and risks/health assessment completed in flowsheets and below - please see for details. No Concerns. Reports is doing very good.   How often do you have a drink containing alcohol? daily How many drinks containing alcohol do you have on a typical day when you are drinking? 1 drink How often do you have six or more drinks on one occasion? n Have you ever smoked?n Quit date if applicable? na  How many packs a day do/did you smoke? na Do you use smokeless tobacco?na Do you use an illicit drugs?n Do you feel safe at home?y Last dentist visit?goes on regular basis Last eye Exam and location?[name], goes once a year   See HM section in Epic for other details of completed HM.    ROS: negative for report of fevers, unintentional weight loss, vision changes, vision loss, hearing loss or change, chest pain, sob, hemoptysis, melena, hematochezia, hematuria or bleeding or bruising  Patient-completed extensive health risk assessment - reviewed and discussed with the patient: See Health Risk Assessment completed with patient prior  to the visit either above or in recent phone note. This was reviewed in detailed with the patient today and appropriate recommendations, orders and referrals were placed as needed per Summary below and patient instructions.   Review of Medical History: -PMH, PSH, Family History and current specialty and care providers reviewed and updated and listed below   Patient Care Team: [name] LABOR, MD as PCP - General [name], MD (Obstetrics and Gynecology)   Past Medical History:  Diagnosis Date   Acute cholecystitis 01/04/2018   Allergy    Arthritis    Cataract    forming    Essential hypertension 07/23/2007   Qualifier: Diagnosis of  By: [name], [name]     Gynecological examination    sees Dr. Ade Debrah   Hyperlipemia, mixed 07/23/2007   Qualifier: Diagnosis of  By: [name], [name]     Hypertension    PONV (postoperative nausea and vomiting)     Past Surgical History:  Procedure Laterality Date   cataract surgery Bilateral    01/2022   CHOLECYSTECTOMY N/A 01/04/2018   Procedure: LAPAROSCOPIC CHOLECYSTECTOMY;  Surgeon: [name], MD;  Location: [facility] ORS;  Service: General;  Laterality: N/A;   COLONOSCOPY  08/26/2022   per Dr. Legrand, adeomatous polyps, repeat in 3 yrs   TONSILLECTOMY     TOTAL KNEE ARTHROPLASTY Bilateral 11/11/2017   Procedure: TOTAL KNEE BILATERAL;  Surgeon: [name], MD;  Location: [facility] ORS;  Service: Orthopedics;  Laterality: Bilateral;   TUBAL LIGATION  1985    Social History   Socioeconomic History   Marital status: Married    Spouse name: Not on file   Number of children: Not on file   Years of education: Not on file   Highest education level: Not  on file  Occupational History   Not on file  Tobacco Use   Smoking status: Never   Smokeless tobacco: Never  Vaping Use   Vaping status: Never Used  Substance and Sexual Activity   Alcohol use: Yes    Alcohol/week: 8.0 standard drinks of alcohol    Types: 8  Glasses of wine per week   Drug use: No   Sexual activity: Yes  Other Topics Concern   Not on file  Social History Narrative   Not on file   Social Drivers of Health   Tobacco Use: Low Risk (11/08/2024)   Patient History    Smoking Tobacco Use: Never    Smokeless Tobacco Use: Never    Passive Exposure: Not on file  Financial Resource Strain: Low Risk (04/21/2022)   Overall Financial Resource Strain (CARDIA)    Difficulty of Paying Living Expenses: Not hard at all  Food Insecurity: No Food Insecurity (11/08/2024)   Epic    Worried About Programme Researcher, Broadcasting/film/video in the Last Year: Never true    Ran Out of Food in the Last Year: Never true  Transportation Needs: No Transportation Needs (04/21/2022)   PRAPARE - Administrator, Civil Service (Medical): No    Lack of Transportation (Non-Medical): No  Physical Activity: Inactive (11/08/2024)   Exercise Vital Sign    Days of Exercise per Week: 0 days    Minutes of Exercise per Session: 0 min  Stress: Stress Concern Present (11/08/2024)   [employer] of Occupational Health - Occupational Stress Questionnaire    Feeling of Stress: Very much  Social Connections: Socially Integrated (11/08/2024)   Social Connection and Isolation Panel    Frequency of Communication with Friends and Family: More than three times a week    Frequency of Social Gatherings with Friends and Family: More than three times a week    Attends Religious Services: More than 4 times per year    Active Member of [employer] or Organizations: Yes    Attends Engineer, Structural: More than 4 times per year    Marital Status: Married  Catering Manager Violence: Not on file  Depression (PHQ2-9): Low Risk (11/08/2024)   Depression (PHQ2-9)    PHQ-2 Score: 0  Alcohol Screen: Not on file  Housing: Not on file  Utilities: Not on file  Health Literacy: Adequate Health Literacy (11/08/2024)   B1300 Health Literacy    Frequency of need for help with medical  instructions: Never    Family History  Problem Relation Age of Onset   Hypertension Mother    Thyroid  disease Father    Colon polyps Neg Hx    Colon cancer Neg Hx    Esophageal cancer Neg Hx    Rectal cancer Neg Hx    Stomach cancer Neg Hx     Medications Ordered Prior to Encounter[1]  Allergies[2]     Physical Exam Vitals requested from patient and listed below if patient had equipment and was able to obtain at home for this virtual visit: There were no vitals filed for this visit. Estimated body mass index is 32.42 kg/m as calculated from the following:   Height as of 01/04/24: 5' 7 (1.702 m).   Weight as of 01/04/24: 207 lb (93.9 kg).  EKG (optional): deferred due to virtual visit  GENERAL: alert, oriented, no acute distress detected, full vision exam deferred due to pandemic and/or virtual encounter  PSYCH/NEURO: pleasant and cooperative, no obvious depression or anxiety, speech  and thought processing grossly intact, Cognitive function grossly intact  Flowsheet Row Office Visit from 01/04/2024 in [facility] [place]  PHQ-9 Total Score 2        11/08/2024   10:27 AM 11/08/2024    9:51 AM 01/04/2024    1:07 PM 02/17/2023    8:49 AM 06/25/2022    8:42 AM  Depression screen PHQ 2/9  Decreased Interest 0 0 0 1 1  Down, Depressed, Hopeless 0 0 0 1 1  PHQ - 2 Score 0 0 0 2 2  Altered sleeping   1 2 1   Tired, decreased energy   0 2 2  Change in appetite   0 0 2  Feeling bad or failure about yourself    1 1 1   Trouble concentrating   0 0 0  Moving slowly or fidgety/restless   0 0 0  Suicidal thoughts   0 0 0  PHQ-9 Score   2  7  8    Difficult doing work/chores   Not difficult at all Somewhat difficult Somewhat difficult     Data saved with a previous flowsheet row definition       04/21/2022    4:05 PM 06/25/2022    8:41 AM 02/17/2023    8:49 AM 11/08/2024    9:51 AM 11/08/2024   10:16 AM  Fall Risk  Falls in the past year? 0 0 0 0    Was there an injury with Fall? 0  0  0  0   Fall Risk Category Calculator 0 0 0 0   Fall Risk Category (Retired) Low  Low      (RETIRED) Patient Fall Risk Level Low fall risk  Low fall risk      Patient at Risk for Falls Due to Medication side effect No Fall Risks No Fall Risks No Fall Risks Other (Comment)  Fall risk Follow up Falls evaluation completed;Education provided;Falls prevention discussed  Falls evaluation completed  Falls evaluation completed Falls evaluation completed      Data saved with a previous flowsheet row definition     SUMMARY AND PLAN:  Estrogen deficiency - Plan: DG Bone Density   Discussed applicable health maintenance/preventive health measures and advised and referred or ordered per patient preferences: -she wants to get [facility], order placed, advised she call to schedule and call our office if any issues scheduling, advised to follow up with Dr. Johnny after completed -discussed vaccines due recs and risks, she is considering getting several at the pharmacy Health Maintenance  Topic Date Due   Bone Density Scan  Never done   DTaP/Tdap/Td (1 - Tdap) 11/09/2024 (Originally 10/15/1970)   Medicare Annual Wellness (AWV)  11/09/2024 (Originally 04/22/2023)   Zoster Vaccines- Shingrix (1 of 2) 11/09/2024 (Originally 10/14/2002)   COVID-19 Vaccine (3 - 2025-26 season) 11/24/2024 (Originally 07/11/2024)   Influenza Vaccine  02/07/2025 (Originally 06/10/2024)   Hepatitis C Screening  11/08/2025 (Originally 10/14/1969)   Colonoscopy  08/26/2025   Mammogram  08/18/2026   Pneumococcal Vaccine: 50+ Years  Completed   Meningococcal B Vaccine  Aged [employer] and counseling on the following was provided based on the above review of health and a plan/checklist for the patient, along with additional information discussed, was provided for the patient in the patient instructions :   -Advised and counseled on a healthy lifestyle - including the  importance of a healthy diet, regular physical activity, social connections and  sleep -sleep counseling provided  for 5-10 minutes - see pt instructions  -Reviewed patient's current diet. Advised and counseled on a whole foods based healthy diet. A summary of a healthy diet was provided in the Patient Instructions.  -reviewed patient's current physical activity level and discussed exercise guidelines for adults. Discussed community resources and ideas for safe exercise at home to assist in meeting exercise guideline recommendations in a safe and healthy way.  -Advise yearly dental visits at minimum and regular eye exams -Advised and counseled on alcohol safe limits, risks/ tobacco use, risks of smoking and offered counseling/help, drug, opoid use/misuse   Follow up: see patient instructions     There are no Patient Instructions on file for this visit.  Chiquita JONELLE Cramp, DO      [1]  Current Outpatient Medications on File Prior to Visit  Medication Sig Dispense Refill   atorvastatin  (LIPITOR) 20 MG tablet Take 1 tablet (20 mg total) by mouth daily. 90 tablet 3   cholecalciferol (VITAMIN D3) 25 MCG (1000 UT) tablet Take 2,000 Units by mouth daily.     hydrochlorothiazide  (HYDRODIURIL ) 12.5 MG tablet Take 1 tablet (12.5 mg total) by mouth daily. 90 tablet 3   ibuprofen  (ADVIL ) 600 MG tablet      losartan  (COZAAR ) 25 MG tablet Take 1 tablet (25 mg total) by mouth daily. 90 tablet 3   MYRBETRIQ  50 MG TB24 tablet Take 1 tablet (50 mg total) by mouth daily. 30 tablet 11   oseltamivir  (TAMIFLU ) 75 MG capsule Take 1 capsule (75 mg total) by mouth 2 (two) times daily. (Patient not taking: Reported on 11/08/2024) 10 capsule 0   No current facility-administered medications on file prior to visit.  [2] No Known Allergies  "

## 2025-01-10 ENCOUNTER — Other Ambulatory Visit
